# Patient Record
Sex: Female | Born: 1985 | Race: White | Hispanic: No | Marital: Married | Smoking: Never smoker
Health system: Southern US, Community
[De-identification: ages and names within clinical notes are randomized; demographics above are authoritative.]

## PROBLEM LIST (undated history)

## (undated) DIAGNOSIS — Z982 Presence of cerebrospinal fluid drainage device: Secondary | ICD-10-CM

---
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
## 2005-05-07 ENCOUNTER — Emergency Department: Payer: Self-pay | Admitting: Emergency Medicine

## 2005-07-02 ENCOUNTER — Emergency Department (HOSPITAL_COMMUNITY): Admission: EM | Admit: 2005-07-02 | Discharge: 2005-07-03 | Payer: Self-pay | Admitting: Emergency Medicine

## 2007-05-19 ENCOUNTER — Inpatient Hospital Stay (HOSPITAL_COMMUNITY): Admission: AD | Admit: 2007-05-19 | Discharge: 2007-05-19 | Payer: Self-pay | Admitting: Obstetrics & Gynecology

## 2007-06-05 ENCOUNTER — Emergency Department (HOSPITAL_COMMUNITY): Admission: EM | Admit: 2007-06-05 | Discharge: 2007-06-05 | Payer: Self-pay | Admitting: Emergency Medicine

## 2007-06-26 ENCOUNTER — Inpatient Hospital Stay (HOSPITAL_COMMUNITY): Admission: AD | Admit: 2007-06-26 | Discharge: 2007-06-26 | Payer: Self-pay | Admitting: Obstetrics & Gynecology

## 2008-11-05 ENCOUNTER — Emergency Department: Payer: Self-pay | Admitting: Internal Medicine

## 2009-02-08 ENCOUNTER — Inpatient Hospital Stay (HOSPITAL_COMMUNITY): Admission: AD | Admit: 2009-02-08 | Discharge: 2009-02-08 | Payer: Self-pay | Admitting: Obstetrics & Gynecology

## 2010-04-16 LAB — GC/CHLAMYDIA PROBE AMP, GENITAL
Chlamydia, DNA Probe: NEGATIVE
GC Probe Amp, Genital: NEGATIVE

## 2010-04-16 LAB — URINALYSIS, ROUTINE W REFLEX MICROSCOPIC
Bilirubin Urine: NEGATIVE
Nitrite: NEGATIVE
Protein, ur: NEGATIVE mg/dL
Specific Gravity, Urine: >1.03 — ABNORMAL HIGH (ref 1.005–1.030)
Urobilinogen, UA: 0.2 mg/dL (ref 0.0–1.0)

## 2010-04-16 LAB — WET PREP, GENITAL
Clue Cells Wet Prep HPF POC: NONE SEEN
Trich, Wet Prep: NONE SEEN
Yeast Wet Prep HPF POC: NONE SEEN

## 2010-04-16 LAB — URINE MICROSCOPIC-ADD ON

## 2010-04-16 LAB — CBC
MCV: 92.9 fL (ref 78.0–100.0)
Platelets: 206 10*3/uL (ref 150–400)
RDW: 13.6 % (ref 11.5–15.5)
WBC: 9.3 10*3/uL (ref 4.0–10.5)

## 2010-04-16 LAB — ABO/RH: ABO/RH(D): A POS

## 2010-07-26 ENCOUNTER — Emergency Department: Payer: Self-pay | Admitting: Emergency Medicine

## 2010-10-24 LAB — URINALYSIS, ROUTINE W REFLEX MICROSCOPIC
Bilirubin Urine: NEGATIVE
Hgb urine dipstick: NEGATIVE
Ketones, ur: NEGATIVE
Nitrite: NEGATIVE
Protein, ur: NEGATIVE
Specific Gravity, Urine: >1.03 — ABNORMAL HIGH
Urobilinogen, UA: 0.2

## 2010-10-24 LAB — WET PREP, GENITAL
Clue Cells Wet Prep HPF POC: NONE SEEN
Trich, Wet Prep: NONE SEEN

## 2010-10-24 LAB — CBC
HCT: 39.8
Hemoglobin: 13.7
MCHC: 34.5
MCV: 88.7
RBC: 4.49
RDW: 13.2

## 2010-10-24 LAB — POCT PREGNANCY, URINE: Operator id: 23932

## 2010-10-24 LAB — GC/CHLAMYDIA PROBE AMP, GENITAL: GC Probe Amp, Genital: NEGATIVE

## 2010-10-25 LAB — URINALYSIS, ROUTINE W REFLEX MICROSCOPIC
Bilirubin Urine: NEGATIVE
Hgb urine dipstick: NEGATIVE
Nitrite: NEGATIVE
Protein, ur: NEGATIVE
Specific Gravity, Urine: 1.015
Urobilinogen, UA: 0.2

## 2010-10-25 LAB — RAPID STREP SCREEN (MED CTR MEBANE ONLY): Streptococcus, Group A Screen (Direct): NEGATIVE

## 2013-05-15 ENCOUNTER — Ambulatory Visit (INDEPENDENT_AMBULATORY_CARE_PROVIDER_SITE_OTHER): Payer: BLUE CROSS/BLUE SHIELD | Admitting: Physician Assistant

## 2013-05-15 VITALS — BP 116/60 | HR 79 | Temp 98.5°F | Resp 12 | Ht 63.0 in | Wt 138.0 lb

## 2013-05-15 DIAGNOSIS — R109 Unspecified abdominal pain: Secondary | ICD-10-CM

## 2013-05-15 DIAGNOSIS — N939 Abnormal uterine and vaginal bleeding, unspecified: Secondary | ICD-10-CM

## 2013-05-15 DIAGNOSIS — Z113 Encounter for screening for infections with a predominantly sexual mode of transmission: Secondary | ICD-10-CM

## 2013-05-15 DIAGNOSIS — N898 Other specified noninflammatory disorders of vagina: Secondary | ICD-10-CM

## 2013-05-15 LAB — POCT WET PREP WITH KOH
CLUE CELLS WET PREP PER HPF POC: NEGATIVE
KOH Prep POC: NEGATIVE
RBC Wet Prep HPF POC: NEGATIVE
TRICHOMONAS UA: NEGATIVE
Yeast Wet Prep HPF POC: NEGATIVE

## 2013-05-15 LAB — POCT URINE PREGNANCY: PREG TEST UR: NEGATIVE

## 2013-05-15 NOTE — Patient Instructions (Signed)
Safe Sex Safe sex is about reducing the risk of giving or getting a sexually transmitted disease (STD). STDs are spread through sexual contact involving the genitals, mouth, or rectum. Some STDS can be cured and others cannot. Safe sex can also prevent unintended pregnancies.  SAFE SEX PRACTICES  Limit your sexual activity to only one partner who is only having sex with you.  Talk to your partner about their past partners, past STDs, and drug use.  Use a condom every time you have sexual intercourse. This includes vaginal, oral, and anal sexual activity. Both females and males should wear condoms during oral sex. Only use latex or polyurethane condoms and water-based lubricants. Petroleum-based lubricants or oils used to lubricate a condom will weaken the condom and increase the chance that it will break. The condom should be in place from the beginning to the end of sexual activity. Wearing a condom reduces, but does not completely eliminate, your risk of getting or giving a STD. STDs can be spread by contact with skin of surrounding areas.  Get vaccinated for hepatitis B and HPV.  Avoid alcohol and recreational drugs which can affect your judgement. You may forget to use a condom or participate in high-risk sex.  For females, avoid douching after sexual intercourse. Douching can spread an infection farther into the reproductive tract.  Check your body for signs of sores, blisters, rashes, or unusual discharge. See your caregiver if you notice any of these signs.  Avoid sexual contact if you have symptoms of an infection or are being treated for an STD. If you or your partner has herpes, avoid sexual contact when blisters are present. Use condoms at all other times.  See your caregiver for regular screenings, examinations, and tests for STDs. Before having sex with a new partner, each of you should be screened for STDs and talk about the results with your partner. BENEFITS OF SAFE SEX   There  is less of a chance of getting or giving an STD.  You can prevent unwanted or unintended pregnancies.  By discussing safer sex concerns with your partner, you may increase feelings of intimacy, comfort, trust, and honesty between the both of you. Document Released: 02/23/2004 Document Revised: 10/10/2011 Document Reviewed: 07/09/2011 [employer] Patient Information 2014 [employer].  

## 2013-05-15 NOTE — Progress Notes (Signed)
Subjective:    Patient ID: Denise Kline, female    DOB: 03/05/1985, 28 y.o.   MRN: [id number]  HPI   Denise Kline is a pleasant 28 yr old female here requesting STD testing.  She reports that she was with someone about 8 months ago who abruptly left her - she now wonders if maybe "he had something."  She reports some "pinkish bumps" in her groin area - not bothering her, no drainage, no pain, no itching.  She also has a spot on the back of her thigh that she is concerned about.  She does feel like she has had some increased vaginal discharge with some odor.  No itching, irritation.  She notes some vaginal spotting that started yesterday or the day before.  LMP 04/22/13 - regular every month.  She reports some bloating and lower abd discomfort.  Also has some low back pain.  She denies dysuria but is going more frequently.  She has never been tested for STIs.  She is currently sexually active with 1 female partner who she has been with for the last 4 months.  They use condoms inconsistently.  She reports that she and her partner do not have sex very frequently, like once per month, so she does not think she could be pregnant.  Last intercourse was about 1 wk ago, which is likely when she was ovulating.  She is not using any contraception because she is scared of it.  Concerned for blood clots.   Review of Systems  Constitutional: Negative for fever and chills.  Respiratory: Negative.   Cardiovascular: Negative.   Gastrointestinal: Positive for abdominal pain (lower, bloating). Negative for nausea and vomiting.  Genitourinary: Positive for frequency, vaginal bleeding and vaginal discharge. Negative for dysuria, menstrual problem, pelvic pain and dyspareunia.  Musculoskeletal: Negative.   Skin: Negative.        Objective:   Physical Exam  Vitals reviewed. Constitutional: She is oriented to person, place, and time. She appears well-developed and well-nourished. No distress.  HENT:  Head:  Normocephalic and atraumatic.  Eyes: Conjunctivae are normal. No scleral icterus.  Cardiovascular: Normal rate, regular rhythm and normal heart sounds.   Pulmonary/Chest: Effort normal and breath sounds normal. She has no wheezes. She has no rales.  Abdominal: Soft. Bowel sounds are normal. There is no tenderness.  Genitourinary: Uterus normal. There is no rash, tenderness or lesion on the right labia. There is no rash, tenderness or lesion on the left labia. Cervix exhibits no motion tenderness, no discharge and no friability. Right adnexum displays no mass and no tenderness. Left adnexum displays no mass and no tenderness. There is bleeding around the vagina.  Small flesh colored papule at mons pubis - ?molluscum, but no other lesions; small amount of blood in the vaginal vault; cervix normal, not friable; no CMT  Neurological: She is alert and oriented to person, place, and time.  Skin: Skin is warm and dry.  Psychiatric: She has a normal mood and affect. Her behavior is normal.    Results for orders placed in visit on 05/15/13  POCT URINE PREGNANCY      Result Value Ref Range   Preg Test, Ur Negative    POCT WET PREP WITH KOH      Result Value Ref Range   Trichomonas, UA Negative     Clue Cells Wet Prep HPF POC neg     Epithelial Wet Prep HPF POC 1-3     Yeast Wet Prep  HPF POC neg     Bacteria Wet Prep HPF POC 1+     RBC Wet Prep HPF POC neg     WBC Wet Prep HPF POC 0-1     KOH Prep POC Negative         Assessment & Plan:  Screen for STD (sexually transmitted disease) - Plan: POCT Wet Prep with KOH, GC/Chlamydia Probe Amp, HIV antibody, HSV(herpes simplex vrs) 1+2 ab-IgG, RPR  Abdominal pain - Plan: POCT urine pregnancy, POCT Wet Prep with KOH, GC/Chlamydia Probe Amp  Vaginal spotting   Denise Kline is a pleasant 28 yr old female here for STD screening.  Wet prep is negative.  Gc/chlamydia, HIV, RPR, and HSV all pending.  She has a small flesh colored papule at the mons pubis  - it is not bothersome to her.  Possible molluscum?  Since the area is not bothersome, will keep an eye on it for now - if worsening or becoming bothersome would consider treatment/removal.  Pt is currently experiencing vaginal spotting (period due next week.)  This is abnormal for her.  Urine hcg is negative today, but cannot completely exclude pregnancy at this point - last intercourse was about 1 wk ago, which is likely when she was ovulating.  If she does not get a normal period within 2 wks, she needs to check a home pregnancy test.  We discussed contraceptive options and I have given her materials to read over.  Encouraged her to use condoms with every sexual encounter going forward.  Pt to call or RTC if worsening or not improving  E. Frances FurbishElizabeth Mirabella Hilario MHS, PA-C [facility] 4/17/20152:47 PM

## 2013-05-16 LAB — GC/CHLAMYDIA PROBE AMP
CT PROBE, AMP APTIMA: NEGATIVE
GC PROBE AMP APTIMA: NEGATIVE

## 2013-05-16 LAB — HIV ANTIBODY (ROUTINE TESTING W REFLEX): HIV: NONREACTIVE

## 2013-05-16 LAB — RPR

## 2013-05-18 LAB — HSV(HERPES SIMPLEX VRS) I + II AB-IGG: HSV 1 GLYCOPROTEIN G AB, IGG: 0.13 IV

## 2014-04-16 ENCOUNTER — Emergency Department (HOSPITAL_COMMUNITY)
Admission: EM | Admit: 2014-04-16 | Discharge: 2014-04-16 | Disposition: A | Discharge status: Home / Self Care | Payer: Medicaid Other | Attending: Emergency Medicine | Admitting: Emergency Medicine

## 2014-04-16 ENCOUNTER — Encounter (HOSPITAL_COMMUNITY): Payer: Self-pay | Admitting: Neurology

## 2014-04-16 ENCOUNTER — Emergency Department (HOSPITAL_COMMUNITY): Payer: Medicaid Other

## 2014-04-16 DIAGNOSIS — Y92481 Parking lot as the place of occurrence of the external cause: Secondary | ICD-10-CM | POA: Insufficient documentation

## 2014-04-16 DIAGNOSIS — X58XXXA Exposure to other specified factors, initial encounter: Secondary | ICD-10-CM | POA: Diagnosis not present

## 2014-04-16 DIAGNOSIS — Y939 Activity, unspecified: Secondary | ICD-10-CM | POA: Insufficient documentation

## 2014-04-16 DIAGNOSIS — Y990 Civilian activity done for income or pay: Secondary | ICD-10-CM | POA: Insufficient documentation

## 2014-04-16 DIAGNOSIS — S93401A Sprain of unspecified ligament of right ankle, initial encounter: Secondary | ICD-10-CM | POA: Diagnosis not present

## 2014-04-16 DIAGNOSIS — S99911A Unspecified injury of right ankle, initial encounter: Secondary | ICD-10-CM | POA: Diagnosis present

## 2014-04-16 MED ORDER — HYDROCODONE-ACETAMINOPHEN 5-325 MG PO TABS: 1.0000 | ORAL_TABLET | Freq: Four times a day (QID) | ORAL | Status: DC | PRN

## 2014-04-16 NOTE — ED Notes (Addendum)
Pt sts she tripped over a concrete slab in a parking lot.  Small abrasion noted to palm of right hand.  Denies any pain in arms, wrists, back.  Sts pain has been getting increasingly worse in her ankle.  Denies any dizziness before the fall, sts it was "just a trip"

## 2014-04-16 NOTE — ED Notes (Signed)
Pt reports twist right ankle while tripping over cement block. Pt is a x 4

## 2014-04-16 NOTE — Discharge Instructions (Signed)

## 2014-04-16 NOTE — Progress Notes (Signed)
Orthopedic Tech Progress Note Patient Details:  Denise Kline 11/19/1985 [id number]  Ortho Devices Type of Ortho Device: CAM walker, Crutches Ortho Device/Splint Location: RLE Ortho Device/Splint Interventions: Ordered, Application   Jennye MoccasinHughes, Cecil Vandyke Craig 04/16/2014, 9:26 PM

## 2014-04-16 NOTE — ED Provider Notes (Signed)
CSN: [id number]     Arrival date & time 04/16/14  1851 History  This chart was scribed for non-physician practitioner working with Rolland Porter, MD by Murriel Hopper, ED Scribe. This patient was seen in room TR08C/TR08C and the patient's care was started at 8:54 PM.     Chief Complaint  Patient presents with  . Ankle Pain      The history is provided by the patient. No language interpreter was used.     HPI Comments: Denise Kline is a 29 y.o. female who presents to the Emergency Department complaining of worsening constant right ankle pain with associated swelling that has been present since earlier this evening when pt was at work. Pt states she tripped over a cement block while she was trying to step over it and twisted her ankle. Pt states she cannot perform weight-bearing activity without pain. Pt denies any dizziness before the fall.    History reviewed. No pertinent past medical history. History reviewed. No pertinent past surgical history. No family history on file. History  Substance Use Topics  . Smoking status: Never Smoker   . Smokeless tobacco: Not on file  . Alcohol Use: No   OB History    No data available     Review of Systems  Musculoskeletal: Positive for joint swelling and arthralgias.  Neurological: Negative for dizziness.      Allergies  Aspirin  Home Medications   Prior to Admission medications   Not on File   BP 105/58 mmHg  Pulse 78  Temp(Src) 98.5 F (36.9 C) (Oral)  Resp 14  SpO2 98%  LMP 04/16/2014 Physical Exam  Constitutional: She is oriented to person, place, and time. She appears well-developed and well-nourished.  HENT:  Head: Normocephalic and atraumatic.  Cardiovascular: Normal rate and intact distal pulses.   Intact distal pulses  Pulmonary/Chest: Effort normal.  Abdominal: She exhibits no distension.  Musculoskeletal:  Moderate tenderness to palpation over ATFL ligament and over dorsal aspect of the foot  No bony  abnormality or deformity Limited ROM in strength secondary to pain  Neurological: She is alert and oriented to person, place, and time.  Sensation intact    Skin: Skin is warm and dry.  Psychiatric: She has a normal mood and affect.  Nursing note and vitals reviewed.   ED Course  Procedures (including critical care time)  DIAGNOSTIC STUDIES: Oxygen Saturation is 98% on RA, normal by my interpretation.    COORDINATION OF CARE: 8:58 PM Discussed treatment plan with pt at bedside and pt agreed to plan.   Labs Review Labs Reviewed - No data to display  Imaging Review Dg Ankle Complete Right  04/16/2014   CLINICAL DATA:  Ankle pain,  EXAM: RIGHT ANKLE - COMPLETE 3+ VIEW  COMPARISON:  None.  FINDINGS: Ankle mortise intact. The talar dome is normal. No malleolar fracture. The calcaneus is normal.  IMPRESSION: No fracture or dislocation.   Electronically Signed   By: Genevive Bi M.D.   On: 04/16/2014 19:46   Dg Foot Complete Right  04/16/2014   CLINICAL DATA:  Ankle pain  EXAM: RIGHT FOOT COMPLETE - 3+ VIEW  COMPARISON:  None.  FINDINGS: No fracture or dislocation of mid foot or forefoot. The phalanges are normal. The calcaneus is normal. No soft tissue abnormality.  IMPRESSION: No fracture or dislocation.   Electronically Signed   By: Genevive Bi M.D.   On: 04/16/2014 19:47     EKG Interpretation None  MDM   Final diagnoses:  Ankle sprain, right, initial encounter    Patient with probable ankle sprain, after tripping on a parking barrier. Plain films are negative. Will was patient may Cam Walker, give crutches, treat pain. Recommend orthopedic follow-up. Patient understands and agrees to plan. She is stable and ready for discharge.  I personally performed the services described in this documentation, which was scribed in my presence. The recorded information has been reviewed and is accurate.     Roxy Horsemanobert Suhey Radford, PA-C 04/16/14 2202  Rolland PorterMark James, MD 04/17/14  2206

## 2014-06-18 ENCOUNTER — Emergency Department (HOSPITAL_COMMUNITY): Payer: Medicaid Other

## 2014-06-18 ENCOUNTER — Encounter (HOSPITAL_COMMUNITY): Payer: Self-pay | Admitting: *Deleted

## 2014-06-18 ENCOUNTER — Emergency Department (HOSPITAL_COMMUNITY)
Admission: EM | Admit: 2014-06-18 | Discharge: 2014-06-18 | Disposition: A | Discharge status: Home / Self Care | Payer: Medicaid Other | Attending: Emergency Medicine | Admitting: Emergency Medicine

## 2014-06-18 DIAGNOSIS — M6281 Muscle weakness (generalized): Secondary | ICD-10-CM | POA: Diagnosis not present

## 2014-06-18 DIAGNOSIS — R079 Chest pain, unspecified: Secondary | ICD-10-CM | POA: Insufficient documentation

## 2014-06-18 LAB — BASIC METABOLIC PANEL
Anion gap: 7 (ref 5–15)
BUN: 13 mg/dL (ref 6–20)
CO2: 27 mmol/L (ref 22–32)
Calcium: 9.5 mg/dL (ref 8.9–10.3)
Chloride: 105 mmol/L (ref 101–111)
Creatinine, Ser: 0.82 mg/dL (ref 0.44–1.00)
GFR calc Af Amer: >60 mL/min (ref >60)
GFR calc non Af Amer: >60 mL/min (ref >60)
Glucose, Bld: 101 mg/dL — ABNORMAL HIGH (ref 65–99)
POTASSIUM: 4 mmol/L (ref 3.5–5.1)
SODIUM: 139 mmol/L (ref 135–145)

## 2014-06-18 LAB — CBC WITH DIFFERENTIAL/PLATELET
BASOS ABS: 0.1 10*3/uL (ref 0.0–0.1)
BASOS PCT: 1 % (ref 0–1)
EOS ABS: 0.1 10*3/uL (ref 0.0–0.7)
Eosinophils Relative: 1 % (ref 0–5)
HCT: 42.5 % (ref 36.0–46.0)
Hemoglobin: 13.5 g/dL (ref 12.0–15.0)
Lymphocytes Relative: 25 % (ref 12–46)
Lymphs Abs: 1.7 10*3/uL (ref 0.7–4.0)
MCH: 29.2 pg (ref 26.0–34.0)
MCHC: 31.8 g/dL (ref 30.0–36.0)
MCV: 91.8 fL (ref 78.0–100.0)
MONO ABS: 0.7 10*3/uL (ref 0.1–1.0)
MONOS PCT: 10 % (ref 3–12)
Neutro Abs: 4.3 10*3/uL (ref 1.7–7.7)
Neutrophils Relative %: 63 % (ref 43–77)
Platelets: 224 10*3/uL (ref 150–400)
RBC: 4.63 MIL/uL (ref 3.87–5.11)
RDW: 12.9 % (ref 11.5–15.5)
WBC: 6.8 10*3/uL (ref 4.0–10.5)

## 2014-06-18 MED ORDER — IOHEXOL 350 MG/ML SOLN
100.0000 mL | Freq: Once | INTRAVENOUS | Status: AC | PRN
  Administered 2014-06-18: 100 mL via INTRAVENOUS

## 2014-06-18 MED ORDER — OXYCODONE-ACETAMINOPHEN 5-325 MG PO TABS: 1.0000 | ORAL_TABLET | ORAL | Status: DC | PRN

## 2014-06-18 MED ORDER — SODIUM CHLORIDE 0.9 % IV SOLN
INTRAVENOUS | Status: DC
  Administered 2014-06-18: 18:00:00 via INTRAVENOUS

## 2014-06-18 MED ORDER — LORAZEPAM 2 MG/ML IJ SOLN
1.0000 mg | Freq: Once | INTRAMUSCULAR | Status: AC
  Administered 2014-06-18: 1 mg via INTRAVENOUS
  Filled 2014-06-18: qty 1

## 2014-06-18 MED ORDER — METHOCARBAMOL 500 MG PO TABS: 500.0000 mg | ORAL_TABLET | Freq: Two times a day (BID) | ORAL | Status: DC

## 2014-06-18 MED ORDER — MORPHINE SULFATE 4 MG/ML IJ SOLN
4.0000 mg | Freq: Once | INTRAMUSCULAR | Status: AC
  Administered 2014-06-18: 4 mg via INTRAVENOUS
  Filled 2014-06-18: qty 1

## 2014-06-18 NOTE — ED Provider Notes (Signed)
CSN: [id number]     Arrival date & time 06/18/14  1608 History   First MD Initiated Contact with Patient 06/18/14 1642     Chief Complaint  Patient presents with  . Numbness  . Extremity Weakness     (Consider location/radiation/quality/duration/timing/severity/associated sxs/prior Treatment) HPI Comments: Patient here complaining of sudden onset of left-sided chest pain that occur while driving. Pain is been persistent and radiates to her shoulder as well as to her supraclavicular region. She feels dyspneic and diaphoretic when she has the pain. Pain characterized as sharp. Pain does radiate to her left arm as well as to a makes it difficult to lift it up. Hasn't had any neck pain. Denies any headache. Denies any numbness or tingling to her left hand. No prior history of same. Denies any history of trauma. Symptoms persistent and nothing makes it better. No treatment use prior to arrival.  Patient is a 29 y.o. female presenting with extremity weakness. The history is provided by the patient.  Extremity Weakness    History reviewed. No pertinent past medical history. History reviewed. No pertinent past surgical history. No family history on file. History  Substance Use Topics  . Smoking status: Never Smoker   . Smokeless tobacco: Not on file  . Alcohol Use: No   OB History    No data available     Review of Systems  Musculoskeletal: Positive for extremity weakness.  All other systems reviewed and are negative.     Allergies  Aspirin  Home Medications   Prior to Admission medications   Medication Sig Start Date End Date Taking? Authorizing Provider  acetaminophen (TYLENOL) 500 MG tablet Take 1,000 mg by mouth every 6 (six) hours as needed for mild pain, moderate pain or headache.   Yes Historical Provider, MD  HYDROcodone-acetaminophen (NORCO/VICODIN) 5-325 MG per tablet Take 1-2 tablets by mouth every 6 (six) hours as needed. Patient not taking: Reported on 06/18/2014  04/16/14   Roxy Horsemanobert Browning, PA-C   BP 112/73 mmHg  Pulse 64  Temp(Src) 98.4 F (36.9 C) (Oral)  Resp 17  SpO2 100%  LMP 06/14/2014 Physical Exam  Constitutional: She is oriented to person, place, and time. She appears well-developed and well-nourished.  Non-toxic appearance. No distress.  HENT:  Head: Normocephalic and atraumatic.  Eyes: Conjunctivae, EOM and lids are normal. Pupils are equal, round, and reactive to light.  Neck: Normal range of motion. Neck supple. No tracheal deviation present. No thyroid mass present.  Cardiovascular: Normal rate, regular rhythm and normal heart sounds.  Exam reveals no gallop.   No murmur heard. Pulmonary/Chest: Effort normal and breath sounds normal. No stridor. No respiratory distress. She has no decreased breath sounds. She has no wheezes. She has no rhonchi. She has no rales.  Abdominal: Soft. Normal appearance and bowel sounds are normal. She exhibits no distension. There is no tenderness. There is no rebound and no CVA tenderness.  Musculoskeletal: Normal range of motion. She exhibits no edema or tenderness.  Neurological: She is alert and oriented to person, place, and time. She has normal strength. No cranial nerve deficit or sensory deficit. GCS eye subscore is 4. GCS verbal subscore is 5. GCS motor subscore is 6.  Skin: Skin is warm and dry. No abrasion and no rash noted.  Psychiatric: She has a normal mood and affect. Her speech is normal and behavior is normal.  Nursing note and vitals reviewed.   ED Course  Procedures (including critical care time) Labs Review Labs  Reviewed  CBC WITH DIFFERENTIAL/PLATELET  BASIC METABOLIC PANEL    Imaging Review No results found.   EKG Interpretation None      MDM   Final diagnoses:  Chest pain   Patient had a negative chest CT. Given pain meds here feels better. She has no signs of vascular compromise to her left arm. Neurologically intact. Stable for discharge     Lorre NickAnthony Dontrae Morini,  MD 06/18/14 2025

## 2014-06-18 NOTE — ED Notes (Signed)
Bed: [id number] Expected date:  Expected time:  Means of arrival:  Comments: Ca ctr pt

## 2014-06-18 NOTE — ED Notes (Signed)
Per EMS- Patient c/o sudden onset of left arm weakness and numbness while driving her car. Strong hand grips, + radial pulses, negative for any injury.

## 2014-06-18 NOTE — Discharge Instructions (Signed)
Return here for severe pain to her chest, shortness of breath, if you are unable to use her left arm.  Chest Pain (Nonspecific) It is often hard to give a specific diagnosis for the cause of chest pain. There is always a chance that your pain could be related to something serious, such as a heart attack or a blood clot in the lungs. You need to follow up with your health care provider for further evaluation. CAUSES   Heartburn.  Pneumonia or bronchitis.  Anxiety or stress.  Inflammation around your heart (pericarditis) or lung (pleuritis or pleurisy).  A blood clot in the lung.  A collapsed lung (pneumothorax). It can develop suddenly on its own (spontaneous pneumothorax) or from trauma to the chest.  Shingles infection (herpes zoster virus). The chest wall is composed of bones, muscles, and cartilage. Any of these can be the source of the pain.  The bones can be bruised by injury.  The muscles or cartilage can be strained by coughing or overwork.  The cartilage can be affected by inflammation and become sore (costochondritis). DIAGNOSIS  Lab tests or other studies may be needed to find the cause of your pain. Your health care provider may have you take a test called an ambulatory electrocardiogram (ECG). An ECG records your heartbeat patterns over a 24-hour period. You may also have other tests, such as:  Transthoracic echocardiogram (TTE). During echocardiography, sound waves are used to evaluate how blood flows through your heart.  Transesophageal echocardiogram (TEE).  Cardiac monitoring. This allows your health care provider to monitor your heart rate and rhythm in real time.  Holter monitor. This is a portable device that records your heartbeat and can help diagnose heart arrhythmias. It allows your health care provider to track your heart activity for several days, if needed.  Stress tests by exercise or by giving medicine that makes the heart beat faster. TREATMENT    Treatment depends on what may be causing your chest pain. Treatment may include:  Acid blockers for heartburn.  Anti-inflammatory medicine.  Pain medicine for inflammatory conditions.  Antibiotics if an infection is present.  You may be advised to change lifestyle habits. This includes stopping smoking and avoiding alcohol, caffeine, and chocolate.  You may be advised to keep your head raised (elevated) when sleeping. This reduces the chance of acid going backward from your stomach into your esophagus. Most of the time, nonspecific chest pain will improve within 2-3 days with rest and mild pain medicine.  HOME CARE INSTRUCTIONS   If antibiotics were prescribed, take them as directed. Finish them even if you start to feel better.  For the next few days, avoid physical activities that bring on chest pain. Continue physical activities as directed.  Do not use any tobacco products, including cigarettes, chewing tobacco, or electronic cigarettes.  Avoid drinking alcohol.  Only take medicine as directed by your health care provider.  Follow your health care provider's suggestions for further testing if your chest pain does not go away.  Keep any follow-up appointments you made. If you do not go to an appointment, you could develop lasting (chronic) problems with pain. If there is any problem keeping an appointment, call to reschedule. SEEK MEDICAL CARE IF:   Your chest pain does not go away, even after treatment.  You have a rash with blisters on your chest.  You have a fever. SEEK IMMEDIATE MEDICAL CARE IF:   You have increased chest pain or pain that spreads to your  arm, neck, jaw, back, or abdomen.  You have shortness of breath.  You have an increasing cough, or you cough up blood.  You have severe back or abdominal pain.  You feel nauseous or vomit.  You have severe weakness.  You faint.  You have chills. This is an emergency. Do not wait to see if the pain will  go away. Get medical help at once. Call your local emergency services (911 in [place] Do not drive yourself to the hospital. MAKE SURE YOU:   Understand these instructions.  Will watch your condition.  Will get help right away if you are not doing well or get worse. Document Released: 10/25/2004 Document Revised: 01/20/2013 Document Reviewed: 08/21/2007 [facility] Patient Information 2015 [place], [place]. This information is not intended to replace advice given to you by your health care provider. Make sure you discuss any questions you have with your health care provider.

## 2014-06-18 NOTE — ED Notes (Addendum)
Pt reports sudden onset of L side pain that radiates to her L neck today while driving.  Then suddenly her L arm became numb and it fell off of the steering wheel.  Pt reports she is able to move her L arm now but is still numb.  Denies any hx of spine problems at  This time.  Pt reports pain in L side of her neck.  States when it gets worse, pain shoots down her L arm causing numbness to L arm without warning.

## 2014-06-20 ENCOUNTER — Emergency Department (HOSPITAL_COMMUNITY)
Admission: EM | Admit: 2014-06-20 | Discharge: 2014-06-20 | Disposition: A | Discharge status: Home / Self Care | Payer: Medicaid Other | Attending: Emergency Medicine | Admitting: Emergency Medicine

## 2014-06-20 ENCOUNTER — Encounter (HOSPITAL_COMMUNITY): Payer: Self-pay | Admitting: *Deleted

## 2014-06-20 DIAGNOSIS — Z3202 Encounter for pregnancy test, result negative: Secondary | ICD-10-CM | POA: Diagnosis not present

## 2014-06-20 DIAGNOSIS — R109 Unspecified abdominal pain: Secondary | ICD-10-CM | POA: Insufficient documentation

## 2014-06-20 DIAGNOSIS — Z79899 Other long term (current) drug therapy: Secondary | ICD-10-CM | POA: Diagnosis not present

## 2014-06-20 DIAGNOSIS — R350 Frequency of micturition: Secondary | ICD-10-CM | POA: Insufficient documentation

## 2014-06-20 HISTORY — DX: Presence of cerebrospinal fluid drainage device: Z98.2

## 2014-06-20 LAB — URINALYSIS, ROUTINE W REFLEX MICROSCOPIC
Bilirubin Urine: NEGATIVE
GLUCOSE, UA: NEGATIVE mg/dL
HGB URINE DIPSTICK: NEGATIVE
Ketones, ur: NEGATIVE mg/dL
Leukocytes, UA: NEGATIVE
Nitrite: NEGATIVE
Protein, ur: NEGATIVE mg/dL
Specific Gravity, Urine: 1.02 (ref 1.005–1.030)
Urobilinogen, UA: 1 mg/dL (ref 0.0–1.0)
pH: 7 (ref 5.0–8.0)

## 2014-06-20 LAB — POC URINE PREG, ED: PREG TEST UR: NEGATIVE

## 2014-06-20 NOTE — ED Notes (Signed)
Pt reports being seen Friday for neck pain that went into her L side and made her L arm go numb. Now feels like the pain has moved to her L flank, waxing and waning. Denies urinary symptoms. Pt reports neck pain still present but not near as severe as previously.

## 2014-06-20 NOTE — ED Provider Notes (Signed)
CSN: [id number]     Arrival date & time 06/20/14  1343 History   First MD Initiated Contact with Patient 06/20/14 1521     Chief Complaint  Patient presents with  . Flank Pain   Denise Kline is a 29 y.o. female who presents to the ED complaining of intermittent left flank pain since yesterday. The patient reports she started having left flank pain that was sharp last night and then resolved. She reports she woke up with left flank pain again this morning but then resolved approximately 3 hours prior to arrival to the emergency department. She currently denies any pain. Patient reports she had sharp 6 out of 10 pain in her left flank and took a Robaxin and her pain has resolved now. Patient also reports associated urinary frequency and urgency to urinate for the past 3 months. Patient denies dysuria or hematuria. She denies history kidney stones. Her last cycle was 06/14/2014 and was normal. Patient reports her pain felt better when laying on her left side. The patient denies fevers, chills, abdominal pain, nausea, vomiting, vaginal bleeding, vaginal discharge, dysuria, hematuria, difficulty urinating, changes to the amount of urination, or rashes. The patient denies trauma or injury to her back. The patient denies any numbness or tingling. Patient denies chest pain, shortness of breath or coughing. The patient was seen 2 days ago complaining of left neck, chest and arm pain. She reports she had an episode where her arm went numb. She reports these symptoms have since resolved and not returned. Patient had a negative CT angiogram of her chest at that time 2 days ago.  (Consider location/radiation/quality/duration/timing/severity/associated sxs/prior Treatment) HPI  Past Medical History  Diagnosis Date  . VP (ventriculoperitoneal) shunt status    History reviewed. No pertinent past surgical history. No family history on file. History  Substance Use Topics  . Smoking status: Never Smoker   .  Smokeless tobacco: Not on file  . Alcohol Use: No   OB History    No data available     Review of Systems  Constitutional: Negative for fever and chills.  HENT: Negative for congestion, ear pain and sore throat.   Eyes: Negative for pain and visual disturbance.  Respiratory: Negative for cough and shortness of breath.   Cardiovascular: Negative for chest pain.  Gastrointestinal: Negative for nausea, vomiting, abdominal pain and diarrhea.  Genitourinary: Positive for urgency, frequency and flank pain. Negative for dysuria, hematuria, vaginal bleeding, vaginal discharge and difficulty urinating.  Musculoskeletal: Negative for back pain and neck pain.  Skin: Negative for rash.  Neurological: Negative for syncope, weakness, light-headedness, numbness and headaches.      Allergies  Aspirin  Home Medications   Prior to Admission medications   Medication Sig Start Date End Date Taking? Authorizing Provider  acetaminophen (TYLENOL) 500 MG tablet Take 1,000 mg by mouth every 6 (six) hours as needed for mild pain, moderate pain or headache.   Yes Historical Provider, MD  methocarbamol (ROBAXIN) 500 MG tablet Take 1 tablet (500 mg total) by mouth 2 (two) times daily. Patient taking differently: Take 500 mg by mouth 2 (two) times daily as needed for muscle spasms.  06/18/14  Yes Lorre NickAnthony Allen, MD  oxyCODONE-acetaminophen (PERCOCET/ROXICET) 5-325 MG per tablet Take 1-2 tablets by mouth every 4 (four) hours as needed for severe pain. 06/18/14  Yes Lorre NickAnthony Allen, MD  HYDROcodone-acetaminophen (NORCO/VICODIN) 5-325 MG per tablet Take 1-2 tablets by mouth every 6 (six) hours as needed. Patient not taking: Reported on  06/18/2014 04/16/14   Roxy Horseman, PA-C   BP 105/65 mmHg  Pulse 70  Temp(Src) 98 F (36.7 C)  Resp 20  SpO2 97%  LMP 06/14/2014 Physical Exam  Constitutional: She appears well-developed and well-nourished. No distress.  Nontoxic appearing.  HENT:  Head: Normocephalic and  atraumatic.  Mouth/Throat: Oropharynx is clear and moist. No oropharyngeal exudate.  Eyes: Conjunctivae are normal. Pupils are equal, round, and reactive to light. Right eye exhibits no discharge. Left eye exhibits no discharge.  Neck: Normal range of motion. Neck supple. No JVD present. No tracheal deviation present.  No neck tenderness. No midline neck tenderness.  Cardiovascular: Normal rate, regular rhythm, normal heart sounds and intact distal pulses.  Exam reveals no gallop and no friction rub.   No murmur heard. Pulmonary/Chest: Effort normal and breath sounds normal. No respiratory distress. She has no wheezes. She has no rales.  Abdominal: Soft. Bowel sounds are normal. She exhibits no distension and no mass. There is no tenderness. There is no rebound and no guarding.  Abdomen is soft and nontender to palpation. Bowel sounds are present. No McBurney's point tenderness.  Negative Rovsing sign. Negative psoas and obturator sign. No CVA tenderness.    Musculoskeletal: Normal range of motion. She exhibits no edema or tenderness.  No bony point back tenderness palpation no back tenderness to palpation. There is no back edema, deformity, step-offs or crepitus. The patient has 5 out of 5 strength in her bilateral upper and lower extremities.  Lymphadenopathy:    She has no cervical adenopathy.  Neurological: She is alert. Coordination normal.  Sensation is intact in her bilateral upper and lower extremities.  Skin: Skin is warm and dry. No rash noted. She is not diaphoretic. No erythema. No pallor.  Psychiatric: She has a normal mood and affect. Her behavior is normal.  Nursing note and vitals reviewed.   ED Course  Procedures (including critical care time) Labs Review Labs Reviewed  URINALYSIS, ROUTINE W REFLEX MICROSCOPIC - Abnormal; Notable for the following:    APPearance CLOUDY (*)    All other components within normal limits  POC URINE PREG, ED    Imaging Review Ct Angio  Chest Pe W/cm &/or Wo Cm  06/18/2014   CLINICAL DATA:  sudden onset of L side pain that radiates to her L neck today while driving. Then suddenly her L arm became numb and it fell off of the steering wheel. Pt reports she is able to move her L arm now but is still numb. Denies any hx of spine problems at This time. Pt reports pain in L side of her neck. States when it gets worse, pain shoots down her L arm causing numbness to L arm without warning  EXAM: CT ANGIOGRAPHY CHEST WITH CONTRAST  TECHNIQUE: Multidetector CT imaging of the chest was performed using the standard protocol during bolus administration of intravenous contrast. Multiplanar CT image reconstructions and MIPs were obtained to evaluate the vascular anatomy.  CONTRAST:  OMNIPAQUE IOHEXOL 350 MG/ML SOLN  COMPARISON:  None.  FINDINGS: Left arm contrast injection. SVC patent. Mild four-chamber cardiac enlargement. Satisfactory opacification of pulmonary arteries noted, and there is no evidence of pulmonary emboli. Patent bilateral pulmonary veins. Adequate contrast opacification of the thoracic aorta with no evidence of dissection, aneurysm, or stenosis. There is classic 3-vessel brachiocephalic arch anatomy without proximal stenosis. No significant atheromatous plaque.  No pleural or pericardial effusion. No hilar or mediastinal adenopathy. Lungs are clear. Thoracic spine and sternum intact.  Coarse posterior calcifications mid thoracic spinal canal . Discontinuous VP shunt catheter in the anterior body wall and left upper abdomen. Remainder of upper abdomen unremarkable.  Review of the MIP images confirms the above findings.  IMPRESSION: 1. No acute PE, thoracic aortic dissection, or other acute finding.   Electronically Signed   By: Corlis Leak M.D.   On: 06/18/2014 18:52     EKG Interpretation None      Filed Vitals:   06/20/14 1356  BP: 105/65  Pulse: 70  Temp: 98 F (36.7 C)  Resp: 20  SpO2: 97%     MDM   Meds given in  ED:  Medications - No data to display  New Prescriptions   No medications on file    Final diagnoses:  Left flank pain   This is a 29 y.o. female who presents to the ED complaining of intermittent left flank pain since yesterday. The patient reports she started having left flank pain that was sharp last night and then resolved. She reports she woke up with left flank pain again this morning but then resolved approximately 3 hours prior to arrival to the emergency department. She currently denies any pain. She has been pain-free since arrival to the emergency department. She denies having any abdominal pain, nausea or vomiting. Patient reports taking roboxin and this resolved her pain. On exam the patient is afebrile and nontoxic appearing. She has no abdominal tenderness palpation. There is no back or flank pain on exam. She denies any urinary symptoms. Will check urinalysis and pregnancy test. She is a negative urine pregnancy. Her urinalysis is unremarkable. It is negative for nitrites and leukocytes. Her there is no hemoglobin in her urine. This does not seem consistent with a kidney stone. This patient's pain likely related to muscle spasm. At revaluation the patient still denies any pain and is ready to be discharged. Strict return precautions provided. I advised the patient to follow-up with their primary care provider this week. I advised the patient to return to the emergency department with new or worsening symptoms or new concerns. The patient verbalized understanding and agreement with plan.    This patient was discussed with Dr. Donnald Garre who agrees with assessment and plan.     Everlene Farrier, PA-C 06/20/14 1659  Arby Barrette, MD 06/22/14 1444

## 2014-06-20 NOTE — Discharge Instructions (Signed)
Flank Pain °Flank pain refers to pain that is located on the side of the body between the upper abdomen and the back. The pain may occur over a short period of time (acute) or may be long-term or reoccurring (chronic). It may be mild or severe. Flank pain can be caused by many things. °CAUSES  °Some of the more common causes of flank pain include: °· Muscle strains.   °· Muscle spasms.   °· A disease of your spine (vertebral disk disease).   °· A lung infection (pneumonia).   °· Fluid around your lungs (pulmonary edema).   °· A kidney infection.   °· Kidney stones.   °· A very painful skin rash caused by the chickenpox virus (shingles).   °· Gallbladder disease.   °HOME CARE INSTRUCTIONS  °Home care will depend on the cause of your pain. In general, °· Rest as directed by your caregiver. °· Drink enough fluids to keep your urine clear or pale yellow. °· Only take over-the-counter or prescription medicines as directed by your caregiver. Some medicines may help relieve the pain. °· Tell your caregiver about any changes in your pain. °· Follow up with your caregiver as directed. °SEEK IMMEDIATE MEDICAL CARE IF:  °· Your pain is not controlled with medicine.   °· You have new or worsening symptoms. °· Your pain increases.   °· You have abdominal pain.   °· You have shortness of breath.   °· You have persistent nausea or vomiting.   °· You have swelling in your abdomen.   °· You feel faint or pass out.   °· You have blood in your urine. °· You have a fever or persistent symptoms for more than 2-3 days. °· You have a fever and your symptoms suddenly get worse. °MAKE SURE YOU:  °· Understand these instructions. °· Will watch your condition. °· Will get help right away if you are not doing well or get worse. °Document Released: 03/08/2005 Document Revised: 10/10/2011 Document Reviewed: 08/30/2011 °[employer]® Patient Information ©2015 [employer]. This information is not intended to replace advice given to you by your  health care provider. Make sure you discuss any questions you have with your health care provider. ° °

## 2015-04-21 IMAGING — CR DG FOOT COMPLETE 3+V*R*
3 series · 3 of 3 positions shown · non-contrast
Comparison: None.

CLINICAL DATA: Ankle pain

EXAM:
RIGHT FOOT COMPLETE - 3+ VIEW

[foot ap]
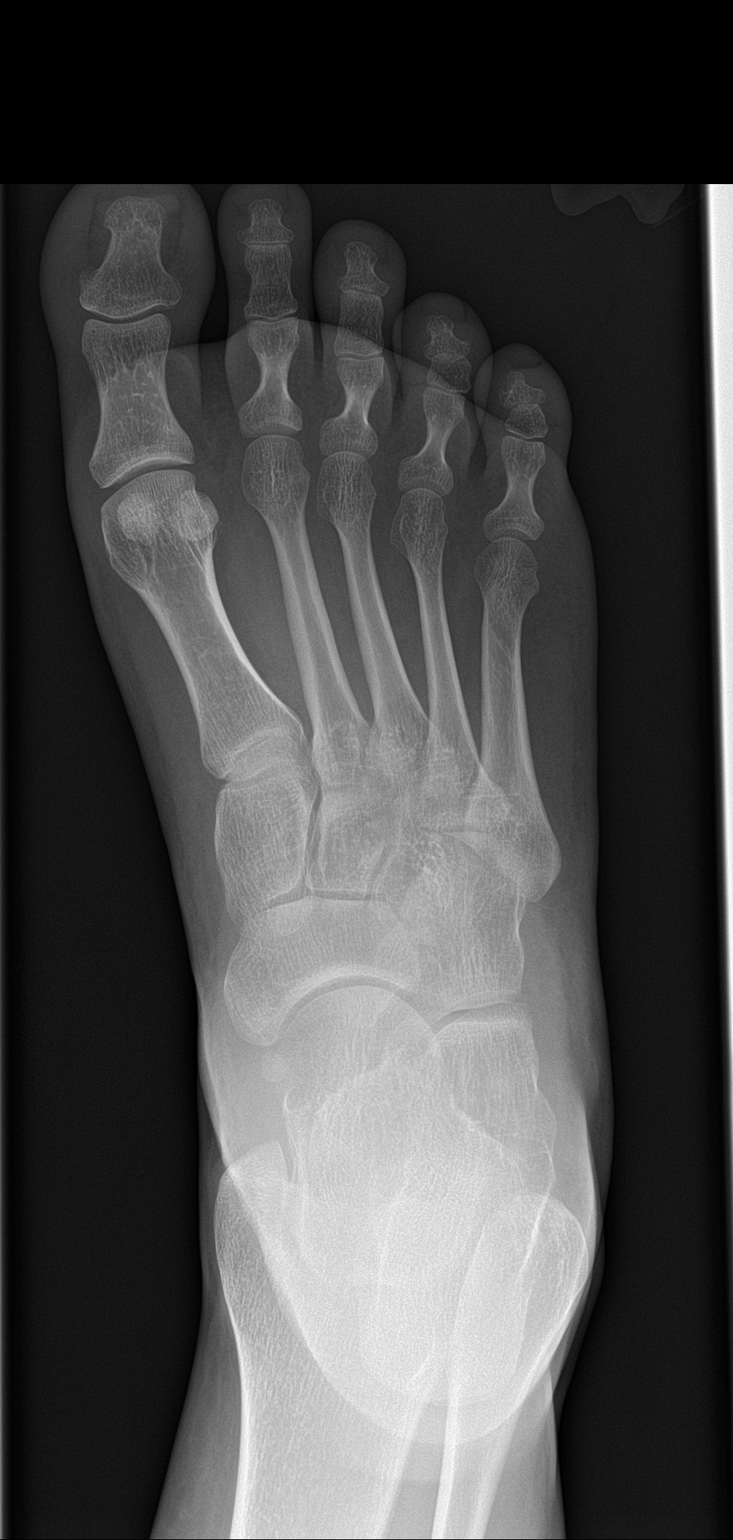

[foot obl]
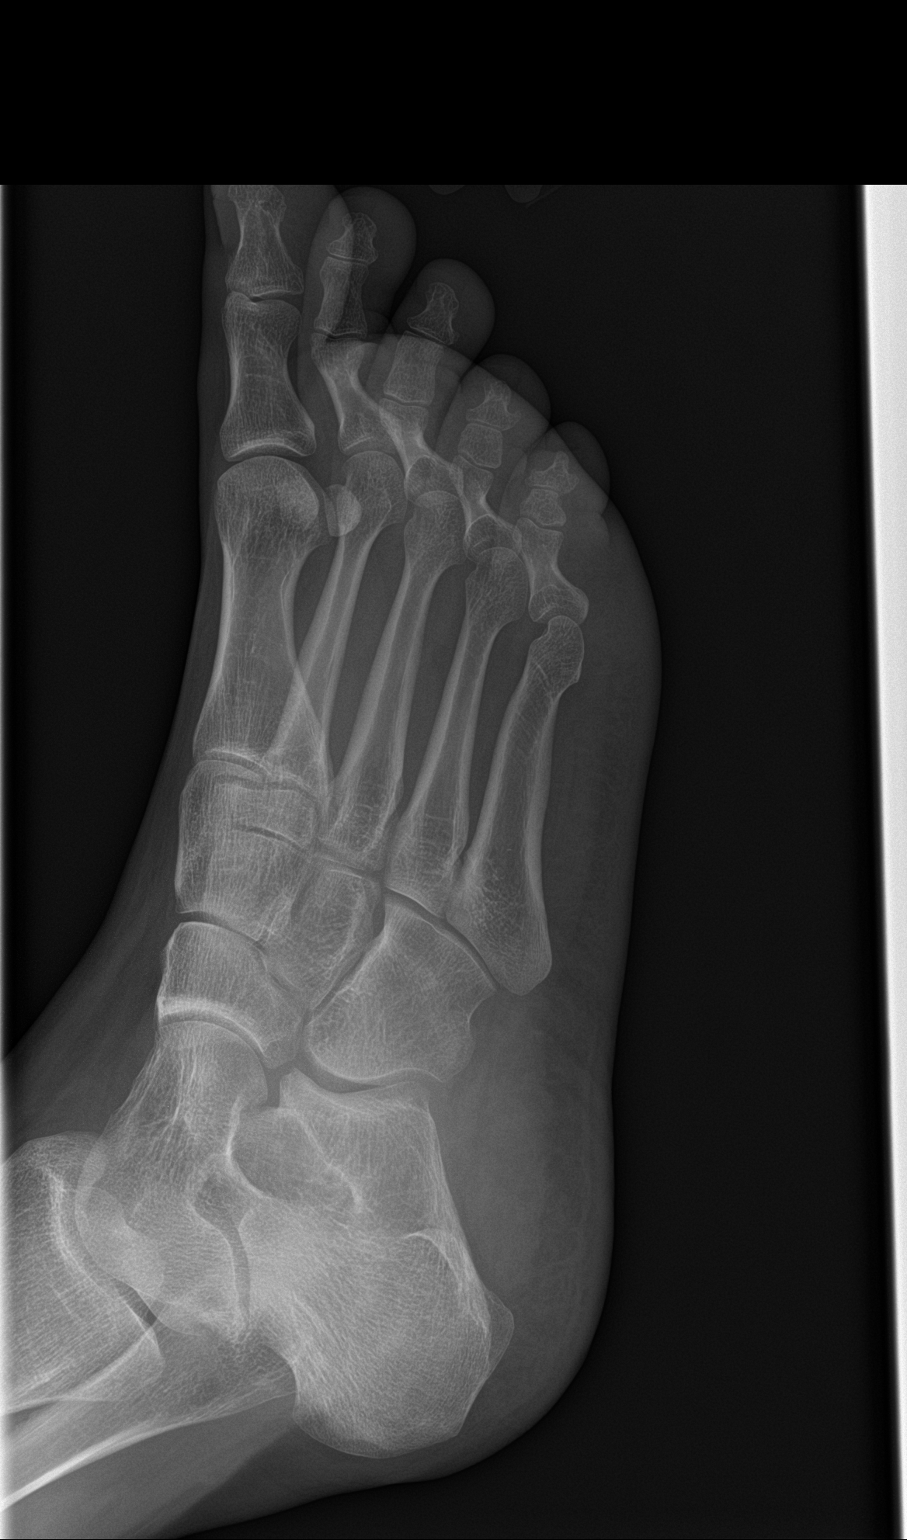

[foot lat]
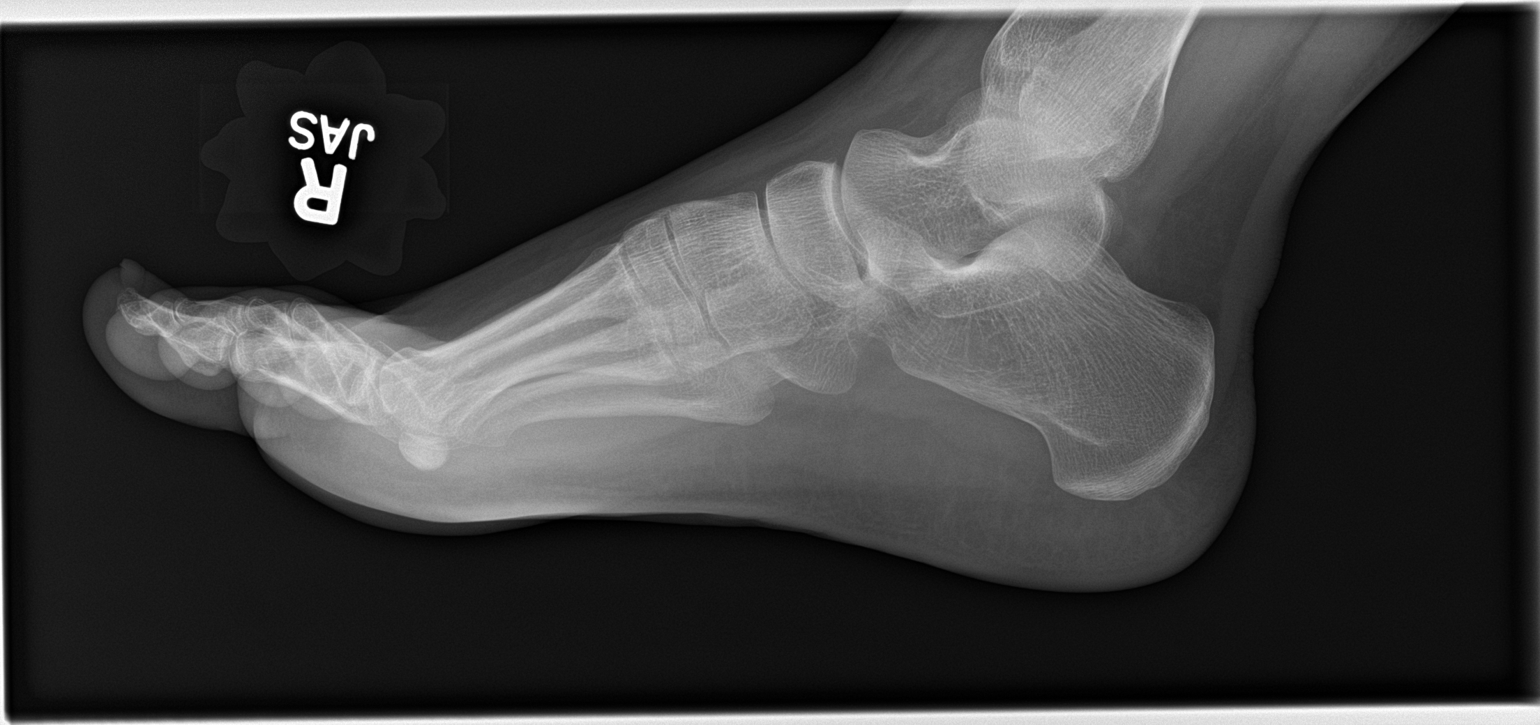

[3 of 3 positions shown; findings below may reference images not displayed]

FINDINGS: No fracture or dislocation of mid foot or forefoot. The phalanges
are normal. The calcaneus is normal. No soft tissue abnormality.
IMPRESSION: No fracture or dislocation.
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
## 2018-08-14 ENCOUNTER — Other Ambulatory Visit: Payer: Self-pay

## 2018-08-14 ENCOUNTER — Other Ambulatory Visit: Payer: Self-pay | Admitting: Internal Medicine

## 2018-08-14 DIAGNOSIS — Z20822 Contact with and (suspected) exposure to covid-19: Secondary | ICD-10-CM

## 2018-08-19 LAB — NOVEL CORONAVIRUS, NAA: SARS-CoV-2, NAA: NOT DETECTED

## 2018-08-29 ENCOUNTER — Encounter (HOSPITAL_COMMUNITY): Payer: Self-pay

## 2018-08-29 ENCOUNTER — Emergency Department (HOSPITAL_COMMUNITY)
Admission: EM | Admit: 2018-08-29 | Discharge: 2018-08-29 | Disposition: A | Discharge status: Home / Self Care | Payer: BC Managed Care – PPO | Attending: Emergency Medicine | Admitting: Emergency Medicine

## 2018-08-29 ENCOUNTER — Emergency Department (HOSPITAL_COMMUNITY): Payer: BC Managed Care – PPO

## 2018-08-29 ENCOUNTER — Other Ambulatory Visit: Payer: Self-pay

## 2018-08-29 DIAGNOSIS — R002 Palpitations: Secondary | ICD-10-CM | POA: Diagnosis not present

## 2018-08-29 DIAGNOSIS — R0789 Other chest pain: Secondary | ICD-10-CM | POA: Diagnosis present

## 2018-08-29 LAB — CBC
HCT: 45.2 % (ref 36.0–46.0)
Hemoglobin: 14.4 g/dL (ref 12.0–15.0)
MCH: 29.3 pg (ref 26.0–34.0)
MCHC: 31.9 g/dL (ref 30.0–36.0)
MCV: 92.1 fL (ref 80.0–100.0)
Platelets: 265 10*3/uL (ref 150–400)
RBC: 4.91 MIL/uL (ref 3.87–5.11)
RDW: 13.1 % (ref 11.5–15.5)
WBC: 6.5 10*3/uL (ref 4.0–10.5)
nRBC: 0 % (ref 0.0–0.2)

## 2018-08-29 LAB — BASIC METABOLIC PANEL
Anion gap: 10 (ref 5–15)
BUN: 6 mg/dL (ref 6–20)
CO2: 20 mmol/L — ABNORMAL LOW (ref 22–32)
Calcium: 9.5 mg/dL (ref 8.9–10.3)
Chloride: 109 mmol/L (ref 98–111)
Creatinine, Ser: 0.88 mg/dL (ref 0.44–1.00)
GFR calc Af Amer: >60 mL/min (ref >60)
GFR calc non Af Amer: >60 mL/min (ref >60)
Glucose, Bld: 86 mg/dL (ref 70–99)
Potassium: 3.3 mmol/L — ABNORMAL LOW (ref 3.5–5.1)
Sodium: 139 mmol/L (ref 135–145)

## 2018-08-29 LAB — TROPONIN I (HIGH SENSITIVITY)
Troponin I (High Sensitivity): 3 ng/L (ref <18)
Troponin I (High Sensitivity): 3 ng/L (ref <18)

## 2018-08-29 LAB — I-STAT BETA HCG BLOOD, ED (MC, WL, AP ONLY): I-stat hCG, quantitative: <5 m[IU]/mL (ref <5)

## 2018-08-29 MED ORDER — SODIUM CHLORIDE 0.9% FLUSH: 3.0000 mL | Freq: Once | INTRAVENOUS | Status: DC

## 2018-08-29 NOTE — ED Notes (Signed)
Patient verbalized understanding of discharge instructions. Opportunity for questions were provided. Pt. ambulatory and discharged home.  

## 2018-08-29 NOTE — ED Triage Notes (Signed)
Pt reports chest pain for 6 months but worse today when she woke up. Denies any other symptoms. Tested negative for COVID 1 week ago.

## 2018-08-29 NOTE — ED Provider Notes (Signed)
[facility] EMERGENCY DEPARTMENT Provider Note   CSN: [id number] Arrival date & time: 08/29/18  1817     History   Chief Complaint Chief Complaint  Patient presents with  . Chest Pain    HPI Denise Kline is a 33 y.o. female.     The history is provided by the patient. No language interpreter was used.  Chest Pain    33 year old female with history of ventriculoperitoneal shunt, non-smoker, presenting for evaluation of chest pain.  Patient report she has had intermittent heart palpitation and chest discomfort ongoing for the past 6 months but worsened today..  She states intermittently throughout the day she would experiencing sensation of heart racing lasting for about 10 to 15 seconds.  Occasionally she will experience some mild discomfort with chest when this happened.  At times she may feel a bit dizzy and lightheadedness.  The symptoms appearing on almost daily basis.  There are no associated fever chills no productive cough, nausea, vomiting, hemoptysis, abdominal pain, back pain, leg swelling or calf pain.  No prior history of PE or DVT no recent surgery, prolonged bedrest, taking oral birth control pill, recent travel or recent surgery.  She does not have any significant cardiac history.  Patient states she is very careful with what she eats and drink and exercise daily.  She does not smoke.  She denies using any kind of energy drinks or caffeinated beverage.  She was told by her coworker she could have a blood clot and decided to come to the ER for further evaluation.  She denies any increasing stress.  She mentioned having a VP shunt when she was a young child and that is no longer functional.  Was noted that patient tested negative for COVID-19 a week ago.  Past Medical History:  Diagnosis Date  . VP (ventriculoperitoneal) shunt status     There are no active problems to display for this patient.   History reviewed. No pertinent surgical history.    OB History   No obstetric history on file.      Home Medications    Prior to Admission medications   Medication Sig Start Date End Date Taking? Authorizing Provider  acetaminophen (TYLENOL) 500 MG tablet Take 1,000 mg by mouth every 6 (six) hours as needed for mild pain, moderate pain or headache.    [provider]  HYDROcodone-acetaminophen (NORCO/VICODIN) 5-325 MG per tablet Take 1-2 tablets by mouth every 6 (six) hours as needed. Patient not taking: Reported on 06/18/2014 04/16/14   Montine Circle, PA-C  methocarbamol (ROBAXIN) 500 MG tablet Take 1 tablet (500 mg total) by mouth 2 (two) times daily. Patient taking differently: Take 500 mg by mouth 2 (two) times daily as needed for muscle spasms.  06/18/14   Lacretia Leigh, MD  oxyCODONE-acetaminophen (PERCOCET/ROXICET) 5-325 MG per tablet Take 1-2 tablets by mouth every 4 (four) hours as needed for severe pain. 06/18/14   Lacretia Leigh, MD    Family History No family history on file.  Social History Social History   Tobacco Use  . Smoking status: Never Smoker  Substance Use Topics  . Alcohol use: No  . Drug use: No     Allergies   Aspirin   Review of Systems Review of Systems  Cardiovascular: Positive for chest pain.  All other systems reviewed and are negative.    Physical Exam Updated Vital Signs BP 108/61   Pulse 74   Temp 98.5 F (36.9 C) (Oral)  Resp 16   LMP 08/25/2018   SpO2 100%   Physical Exam Vitals signs and nursing note reviewed.  Constitutional:      General: She is not in acute distress.    Appearance: She is well-developed.  HENT:     Head: Atraumatic.  Eyes:     Conjunctiva/sclera: Conjunctivae normal.  Neck:     Musculoskeletal: Neck supple.  Cardiovascular:     Rate and Rhythm: Normal rate and regular rhythm.     Pulses: Normal pulses.     Heart sounds: Normal heart sounds.  Pulmonary:     Effort: Pulmonary effort is normal.     Breath sounds: Normal breath sounds.   Abdominal:     Palpations: Abdomen is soft.     Tenderness: There is no abdominal tenderness.  Musculoskeletal:        General: No swelling or tenderness.  Skin:    Findings: No rash.  Neurological:     Mental Status: She is alert and oriented to person, place, and time.  Psychiatric:        Mood and Affect: Mood normal.      ED Treatments / Results  Labs (all labs ordered are listed, but only abnormal results are displayed) Labs Reviewed  BASIC METABOLIC PANEL - Abnormal; Notable for the following components:      Result Value   Potassium 3.3 (*)    CO2 20 (*)    All other components within normal limits  CBC  I-STAT BETA HCG BLOOD, ED ([facility], [facility], [facility] ONLY)  TROPONIN I (HIGH SENSITIVITY)  TROPONIN I (HIGH SENSITIVITY)    EKG EKG Interpretation  Date/Time:  Friday August 29 2018 18:25:46 EDT Ventricular Rate:  89 PR Interval:  140 QRS Duration: 84 QT Interval:  400 QTC Calculation: 486 R Axis:   61 Text Interpretation:  Normal sinus rhythm Confirmed by Virgina NorfolkAdam, [place] [phone]([id number]) on 08/29/2018 7:36:45 PM     Radiology Dg Chest 2 View  Result Date: 08/29/2018 CLINICAL DATA:  Chest pain EXAM: CHEST - 2 VIEW COMPARISON:  March 08, 2017 FINDINGS: Lungs are clear. Heart size and pulmonary vascularity are normal. No adenopathy. No pneumothorax. Shunt catheter is seen medially on the right, unchanged. No bone lesions. IMPRESSION: Ventriculoperitoneal shunt present.  No edema or consolidation. Electronically Signed   By: Bretta BangWilliam  Woodruff III M.D.   On: 08/29/2018 18:55    Procedures Procedures (including critical care time)  Medications Ordered in ED Medications  sodium chloride flush (NS) 0.9 % injection 3 mL (has no administration in time range)     Initial Impression / Assessment and Plan / ED Course  I have reviewed the triage vital signs and the nursing notes.  Pertinent labs & imaging results that were available during my care of the patient were reviewed by me and  considered in my medical decision making (see chart for details).        BP 108/61   Pulse 74   Temp 98.5 F (36.9 C) (Oral)   Resp 16   LMP 08/25/2018   SpO2 100%    Final Clinical Impressions(s) / ED Diagnoses   Final diagnoses:  Palpitations    ED Discharge Orders    None     7:32 PM Patient here with intermittent heart palpitation which has been ongoing for the past 6 months.  She report 1 of her primary concern is potential blood clot although she does not have any significant risk factor for blood clot.  She  is PERC negative.  Chest pain is atypical of ACS.  EKG however does shows prolonged QT with a QTC of 486, and some T wave abnormalities.  Her high-sensitivity troponin is negative.  Chest x-ray without concerning feature.  There is a ventriculoperitoneal shunt present.  Care discussed with Dr. Lockie Molauratolo who have reviewed EKG and did not appreciate any concerning changes.   8:18 PM Evaluation today without any concerning changes.  No concerning arrhythmia.  Vital signs stable.  Patient is afebrile, no hypoxia.  Encourage patient to follow-up with PCP for further evaluation.  May benefit from thyroid check by PCP   Denise Kline, Denise Viner, PA-C 08/29/18 2027    Virgina Norfolkuratolo, Adam, DO 08/30/18 1534

## 2019-01-08 ENCOUNTER — Other Ambulatory Visit: Payer: Self-pay

## 2019-01-08 DIAGNOSIS — Z20822 Contact with and (suspected) exposure to covid-19: Secondary | ICD-10-CM

## 2019-01-09 LAB — NOVEL CORONAVIRUS, NAA: SARS-CoV-2, NAA: DETECTED — AB

## 2019-01-12 ENCOUNTER — Telehealth: Payer: Self-pay | Admitting: *Deleted

## 2019-01-12 NOTE — Telephone Encounter (Signed)
Pt saw results and guideline message on MYChart. Calling back; verified she did she MYChart message with guidelines but did review again with pt. Verbalizes understanding.

## 2019-01-21 ENCOUNTER — Ambulatory Visit: Payer: BC Managed Care – PPO | Attending: Internal Medicine

## 2019-01-21 ENCOUNTER — Other Ambulatory Visit: Payer: BC Managed Care – PPO

## 2019-01-21 DIAGNOSIS — Z20822 Contact with and (suspected) exposure to covid-19: Secondary | ICD-10-CM

## 2019-01-22 LAB — NOVEL CORONAVIRUS, NAA: SARS-CoV-2, NAA: NOT DETECTED

## 2021-08-15 ENCOUNTER — Ambulatory Visit: Payer: BC Managed Care – PPO | Admitting: Family Medicine

## 2021-11-16 ENCOUNTER — Ambulatory Visit
Admission: RE | Admit: 2021-11-16 | Discharge: 2021-11-16 | Disposition: A | Discharge status: Home / Self Care | Payer: 59 | Source: Ambulatory Visit

## 2021-11-16 VITALS — BP 114/78 | HR 97 | Temp 98.7°F | Resp 14

## 2021-11-16 DIAGNOSIS — G8929 Other chronic pain: Secondary | ICD-10-CM

## 2021-11-16 DIAGNOSIS — R42 Dizziness and giddiness: Secondary | ICD-10-CM

## 2021-11-16 DIAGNOSIS — Z982 Presence of cerebrospinal fluid drainage device: Secondary | ICD-10-CM

## 2021-11-16 DIAGNOSIS — R519 Headache, unspecified: Secondary | ICD-10-CM | POA: Diagnosis not present

## 2021-11-16 LAB — POCT URINE PREGNANCY: Preg Test, Ur: NEGATIVE

## 2021-11-16 NOTE — ED Triage Notes (Signed)
Pt presents with c/o dizziness and light headedness that began yesterday. Pt endorses increased sweating.

## 2021-11-16 NOTE — ED Provider Notes (Signed)
[facility] - URGENT CARE C[facility]  Note:  This document was prepared using Conservation officer, historic buildings and may include unintentional dictation errors.  MRN: [id number] DOB: 17-May-1985  Subjective:   Denise Kline is a 36 y.o. female presenting for 1 to 2-week history of intermittent dizziness, lightheadedness, worsening fatigue, lethargy and now having episodes of sweating.  Has a history of depression and anxiety, migraines, congenital hydrocephalus, anemia.  Reports that she has not had her VP shunt checked.  Chart review does show that she had an MRI through [facility] 09/22/2020 without any intracranial abnormality.  No change in size and configuration of the ventricles with ventriculostomy catheter in place.  She has had daily headaches for the better part of 5 years.  These have been managed as migraines with multiple medications depending on the severity of her symptoms.  Denies any active confusion, double vision, vision changes, difficulty with her balance or speech, weakness, numbness or tingling.  No chest pain, heart racing, palpitations.  No history of arrhythmias.  She has previously been diagnosed with ADHD and was managed with Wellbutrin but this medication was stopped and reports that she it was not replaced with another.  She was also previously diagnosed with a seizure disorder but did not get any official testing per patient or medication management for this.  She is currently taking venlafaxine for depression and anxiety.  She does not have a neurologist.  She has been seeing a hospitalist for internal medicine.  She would like to have information for second opinion.  Regarding her headaches, she was previously put on preventative medications but was unable to tolerate them due to the lethargy that it caused.  She is currently hydrating with 2-3 bottles of water daily.  Previously she was drinking a gallon of water daily but this made no change to her headaches.  She also  reports that she drinks a lot of coffee daily, also has energy drinks every day.  She is primarily trying to get energy so that she can go on about her day.  Reports that she has done this for years.  No abdominal pain, nausea, vomiting.  She is not opposed to a pregnancy test.  No smoking, drug use, vaping, marijuana use.  Patient tries to practice really good sleep habits.  One difficulty if she finds that there are many nights where she cannot wind down or feels like she starts to have racing thoughts.  In general she was sleeping about 4 to 5 hours a night for the previous several months but in the past 2 weeks has felt much more lethargic and has been doing 8 to 9 hours a day and still feels like it is not enough.  No current facility-administered medications for this encounter.  Current Outpatient Medications:    venlafaxine XR (EFFEXOR-XR) 150 MG 24 hr capsule, Take 150 mg by mouth daily with breakfast., Disp: , Rfl:    acetaminophen (TYLENOL) 500 MG tablet, Take 1,000 mg by mouth every 6 (six) hours as needed for mild pain, moderate pain or headache. (Patient not taking: Reported on 11/16/2021), Disp: , Rfl:    Allergies  Allergen Reactions   Aspirin     Seizures     Past Medical History:  Diagnosis Date   VP (ventriculoperitoneal) shunt status      History reviewed. No pertinent surgical history.  History reviewed. No pertinent family history.  Social History   Tobacco Use   Smoking status: Never  Substance  Use Topics   Alcohol use: No   Drug use: No    ROS   Objective:   Vitals: BP 114/78 (BP Location: Left Arm)   Pulse 97   Temp 98.7 F (37.1 C) (Oral)   Resp 14   LMP 10/30/2021 (Approximate)   SpO2 97%   Physical Exam Constitutional:      General: She is not in acute distress.    Appearance: Normal appearance. She is well-developed. She is not ill-appearing, toxic-appearing or diaphoretic.  HENT:     Head: Normocephalic and atraumatic.     Nose: Nose  normal.     Mouth/Throat:     Mouth: Mucous membranes are moist.  Eyes:     General: No scleral icterus.       Right eye: No discharge.        Left eye: No discharge.     Extraocular Movements: Extraocular movements intact.  Neck:     Meningeal: Brudzinski's sign and Kernig's sign absent.  Cardiovascular:     Rate and Rhythm: Normal rate and regular rhythm.     Heart sounds: Normal heart sounds. No murmur heard.    No friction rub. No gallop.  Pulmonary:     Effort: Pulmonary effort is normal. No respiratory distress.     Breath sounds: No stridor. No wheezing, rhonchi or rales.  Chest:     Chest wall: No tenderness.  Skin:    General: Skin is warm and dry.  Neurological:     General: No focal deficit present.     Mental Status: She is alert and oriented to person, place, and time.     Cranial Nerves: No cranial nerve deficit, dysarthria or facial asymmetry.     Motor: No weakness or pronator drift.     Coordination: Romberg sign negative. Coordination normal. Finger-Nose-Finger Test and Heel to [facility] Test normal. Rapid alternating movements normal.     Gait: Gait and tandem walk normal.     Deep Tendon Reflexes: Reflexes normal.  Psychiatric:        Attention and Perception: She is attentive. She does not perceive auditory or visual hallucinations.        Mood and Affect: Mood normal. Mood is not anxious, depressed or elated. Affect is not labile, blunt, flat, angry, tearful or inappropriate.        Speech: She is communicative. Speech is not rapid and pressured, delayed, slurred or tangential.        Behavior: Behavior normal. Behavior is not agitated, slowed, aggressive, withdrawn, hyperactive or combative.        Thought Content: Thought content normal. Thought content does not include homicidal or suicidal ideation.        Cognition and Memory: Cognition is not impaired. Memory is not impaired. She does not exhibit impaired recent memory or impaired remote memory.         Judgment: Judgment normal. Judgment is not impulsive or inappropriate.    Results for orders placed or performed during the hospital encounter of 11/16/21 (from the past 24 hour(s))  POCT urine pregnancy     Status: None   Collection Time: 11/16/21  3:01 PM  Result Value Ref Range   Preg Test, Ur Negative Negative    Assessment and Plan :   PDMP not reviewed this encounter.  1. Chronic nonintractable headache, unspecified headache type   2. VP (ventriculoperitoneal) shunt status   3. Dizziness     I had an extensive discussion with patient about  the differential.  I believe she would benefit from being managed in getting more extensive work-up, second opinion from a neurology group.  I placed an internal referral to [facility] neurology.  She also would like to start seeing a new primary care provider as she has a very difficult time getting in with her current internal medicine doctor.  I provided her information to a new practice.  I also advised that she contact the headache wellness center.  Regarding her ADHD, recommended she follow-up with [facility].  I did review general management of her headaches and emphasized need to hydrate consistently and eliminate the energy drinks altogether.  Recommended that she could continue with her coffee but is limited to no more than 16 ounces in a day.  I did not appreciate any signs of an acute encephalopathy from her exam and despite the more acute symptoms of lethargy and sweating, the headaches are chronic in nature and therefore we will defer an ER visit.  Furthermore the MRI that was done recently did not show any particular intracranial abnormality.  We discussed use of Excedrin over-the-counter for headaches as well.  Counseled patient on potential for adverse effects with medications prescribed/recommended today, ER and return-to-clinic precautions discussed, patient verbalized understanding.    Wallis Bamberg, PA-C 11/16/21  1542

## 2021-11-17 ENCOUNTER — Telehealth: Payer: Self-pay | Admitting: Emergency Medicine

## 2021-11-17 LAB — CBC
Hematocrit: 44.7 % (ref 34.0–46.6)
Hemoglobin: 14.6 g/dL (ref 11.1–15.9)
MCH: 29 pg (ref 26.6–33.0)
MCHC: 32.7 g/dL (ref 31.5–35.7)
MCV: 89 fL (ref 79–97)
Platelets: 362 10*3/uL (ref 150–450)
RBC: 5.04 x10E6/uL (ref 3.77–5.28)
RDW: 12.3 % (ref 11.7–15.4)
WBC: 8.2 10*3/uL (ref 3.4–10.8)

## 2021-11-17 LAB — COMPREHENSIVE METABOLIC PANEL
ALT: 53 IU/L — ABNORMAL HIGH (ref 0–32)
AST: 28 IU/L (ref 0–40)
Albumin/Globulin Ratio: 1.4 (ref 1.2–2.2)
Albumin: 4.6 g/dL (ref 3.9–4.9)
Alkaline Phosphatase: 94 IU/L (ref 44–121)
BUN/Creatinine Ratio: 8 — ABNORMAL LOW (ref 9–23)
BUN: 6 mg/dL (ref 6–20)
Bilirubin Total: <0.2 mg/dL (ref 0.0–1.2)
CO2: 19 mmol/L — ABNORMAL LOW (ref 20–29)
Calcium: 9.7 mg/dL (ref 8.7–10.2)
Chloride: 104 mmol/L (ref 96–106)
Creatinine, Ser: 0.77 mg/dL (ref 0.57–1.00)
Globulin, Total: 3.3 g/dL (ref 1.5–4.5)
Glucose: 134 mg/dL — ABNORMAL HIGH (ref 70–99)
Potassium: 4 mmol/L (ref 3.5–5.2)
Sodium: 143 mmol/L (ref 134–144)
Total Protein: 7.9 g/dL (ref 6.0–8.5)
eGFR: 102 mL/min/{1.73_m2} (ref >59)

## 2021-11-17 LAB — TSH: TSH: 1.41 u[IU]/mL (ref 0.450–4.500)

## 2021-11-17 NOTE — Telephone Encounter (Signed)
Received a voicemail from patient requesting a review of all abnormals on her blood work.  Reviewed with Dr. Susa Simmonds who states "She is not pregnant. Her thyroid is functioning normally. She has not blood infection or inflammation. She is not anemic. Her blood sugar is somewhat elevated. Unsure, if she ate just prior to blood work or not. If not, she will need to follow up with her PCP to discuss screening for diabetes. She had evidence of liver inflammation but very mildly. Her BUN and CO2 are out of range but likely would fall into range if repeated. Her PCP can recheck that for her. "  Attempted to reach patient x 1, unable to LVM

## 2022-04-18 ENCOUNTER — Emergency Department: Payer: Commercial Managed Care - PPO

## 2022-04-18 ENCOUNTER — Emergency Department
Admission: EM | Admit: 2022-04-18 | Discharge: 2022-04-18 | Disposition: A | Discharge status: Home / Self Care | Payer: Commercial Managed Care - PPO | Attending: Emergency Medicine | Admitting: Emergency Medicine

## 2022-04-18 ENCOUNTER — Encounter: Payer: Self-pay | Admitting: Emergency Medicine

## 2022-04-18 ENCOUNTER — Other Ambulatory Visit: Payer: Self-pay

## 2022-04-18 DIAGNOSIS — R0789 Other chest pain: Secondary | ICD-10-CM | POA: Insufficient documentation

## 2022-04-18 LAB — BASIC METABOLIC PANEL
Anion gap: 12 (ref 5–15)
BUN: 9 mg/dL (ref 6–20)
CO2: 24 mmol/L (ref 22–32)
Calcium: 8.9 mg/dL (ref 8.9–10.3)
Chloride: 103 mmol/L (ref 98–111)
Creatinine, Ser: 0.86 mg/dL (ref 0.44–1.00)
GFR, Estimated: >60 mL/min (ref >60)
Glucose, Bld: 157 mg/dL — ABNORMAL HIGH (ref 70–99)
Potassium: 3.6 mmol/L (ref 3.5–5.1)
Sodium: 139 mmol/L (ref 135–145)

## 2022-04-18 LAB — CBC
HCT: 44 % (ref 36.0–46.0)
Hemoglobin: 13.9 g/dL (ref 12.0–15.0)
MCH: 28.7 pg (ref 26.0–34.0)
MCHC: 31.6 g/dL (ref 30.0–36.0)
MCV: 90.7 fL (ref 80.0–100.0)
Platelets: 300 10*3/uL (ref 150–400)
RBC: 4.85 MIL/uL (ref 3.87–5.11)
RDW: 13 % (ref 11.5–15.5)
WBC: 6.4 10*3/uL (ref 4.0–10.5)
nRBC: 0 % (ref 0.0–0.2)

## 2022-04-18 LAB — POC URINE PREG, ED: Preg Test, Ur: NEGATIVE

## 2022-04-18 LAB — TROPONIN I (HIGH SENSITIVITY)
Troponin I (High Sensitivity): <2 ng/L (ref <18)
Troponin I (High Sensitivity): <2 ng/L (ref <18)

## 2022-04-18 NOTE — ED Triage Notes (Signed)
Patient to ED for left sided chest pain - under breast. Pain radiates into neck and left arm. Pain started around 10am. Denies cardiac hx.

## 2022-04-18 NOTE — Discharge Instructions (Signed)
You will get a phone call from [facility].  I feel that you do need to be reevaluated for the left-sided chest pain.  If you have not heard from them in 2 to 3 days, please call for an appointment. Return emergency department if worsening

## 2022-04-18 NOTE — ED Provider Notes (Signed)
[facility] Provider Note    Event Date/Time   First MD Initiated Contact with Patient 04/18/22 1058     (approximate)   History   Chest Pain   HPI  Denise Kline is a 37 y.o. female with history of VP shunt PRESENTS emergency department complaining of left-sided chest pain that radiates from underneath her breast into her neck and down her left arm.  Symptoms started around 10 AM.  States similar symptoms happened in 2016 and everything was negative.  The pain comes and goes in waves.  Family history of heart disease, her father had a MI at 1.  Patient is a non-smoker.  No known injury.  No nausea or vomiting with pain      Physical Exam   Triage Vital Signs: ED Triage Vitals [04/18/22 1039]  Enc Vitals Group     BP 120/71     Pulse Rate 92     Resp 18     Temp 98.6 F (37 C)     Temp Source Oral     SpO2 95 %     Weight      Height      Head Circumference      Peak Flow      Pain Score 3     Pain Loc      Pain Edu?      Excl. in [place]?     Most recent vital signs: Vitals:   04/18/22 1039  BP: 120/71  Pulse: 92  Resp: 18  Temp: 98.6 F (37 C)  SpO2: 95%     General: Awake, no distress.   CV:  Good peripheral perfusion. regular rate and  rhythm Resp:  Normal effort. Lungs cta Abd:  No distention.   Other:      ED Results / Procedures / Treatments   Labs (all labs ordered are listed, but only abnormal results are displayed) Labs Reviewed  BASIC METABOLIC PANEL - Abnormal; Notable for the following components:      Result Value   Glucose, Bld 157 (*)    All other components within normal limits  CBC  POC URINE PREG, ED  TROPONIN I (HIGH SENSITIVITY)  TROPONIN I (HIGH SENSITIVITY)     EKG  EKG   RADIOLOGY Chest x-ray    PROCEDURES:   Procedures   MEDICATIONS ORDERED IN ED: Medications - No data to display   IMPRESSION / MDM / [place] / ED COURSE  I reviewed the triage vital signs and  the nursing notes.                              Differential diagnosis includes, but is not limited to, MI, angina, esophagitis, cervical radiculopathy  Patient's presentation is most consistent with acute presentation with potential threat to life or bodily function.   Labs and imaging along with EKG ordered  EKG is reassuring with normal sinus rhythm, see physician read  Chest x-ray independently reviewed and interpreted by me as being negative for any acute abnormality.  Labs are reassuring, will await second troponin as symptoms started at 10 AM   Second troponin is reassuring.  Do not feel that the patient had a MI.  Feels this more atypical chest pain.  She appears to be hemodynamically stable.  Will give her referral to cardiology.  Patient is in agreement treatment plan.  She was discharged stable condition.  FINAL CLINICAL IMPRESSION(S) / ED DIAGNOSES   Final diagnoses:  Atypical chest pain     Rx / DC Orders   ED Discharge Orders          Ordered    Ambulatory referral to Cardiology       Comments: If you have not heard from the Cardiology office within the next 72 hours please call [phone].   04/18/22 1528             Note:  This document was prepared using Dragon voice recognition software and may include unintentional dictation errors.    Versie Starks, PA-C 04/18/22 1558    Harvest Dark, MD 04/19/22 2133

## 2022-04-18 NOTE — ED Notes (Signed)
See triage note. Pt complains of intermittent sharp L sided chest pain that radiates to L shoulder and arm. Pt had similar episode this past week. Pain started again today around 1000 but has subsided at this moment. States when pain comes, she becomes dizzy and is diaphoretic. Denies inspiratory pain. Provider saw pt at bedside. Skin is dry. Placed on cardiac monitor.

## 2022-04-18 NOTE — ED Provider Notes (Incomplete)
-----------------------------------------   2:02 PM on 04/18/2022 -----------------------------------------

## 2023-10-22 ENCOUNTER — Other Ambulatory Visit: Payer: Self-pay

## 2023-10-22 ENCOUNTER — Inpatient Hospital Stay
Admission: EM | Admit: 2023-10-22 | Discharge: 2023-10-25 | DRG: 552 | Disposition: A | Discharge status: Home / Self Care | Attending: Internal Medicine | Admitting: Internal Medicine

## 2023-10-22 ENCOUNTER — Emergency Department

## 2023-10-22 DIAGNOSIS — Z886 Allergy status to analgesic agent status: Secondary | ICD-10-CM | POA: Diagnosis not present

## 2023-10-22 DIAGNOSIS — E66813 Obesity, class 3: Secondary | ICD-10-CM | POA: Diagnosis present

## 2023-10-22 DIAGNOSIS — Z1152 Encounter for screening for COVID-19: Secondary | ICD-10-CM

## 2023-10-22 DIAGNOSIS — G43909 Migraine, unspecified, not intractable, without status migrainosus: Secondary | ICD-10-CM | POA: Diagnosis present

## 2023-10-22 DIAGNOSIS — Z6841 Body Mass Index (BMI) 40.0 and over, adult: Secondary | ICD-10-CM | POA: Diagnosis not present

## 2023-10-22 DIAGNOSIS — Z8669 Personal history of other diseases of the nervous system and sense organs: Secondary | ICD-10-CM | POA: Diagnosis not present

## 2023-10-22 DIAGNOSIS — G039 Meningitis, unspecified: Secondary | ICD-10-CM | POA: Diagnosis present

## 2023-10-22 DIAGNOSIS — R42 Dizziness and giddiness: Secondary | ICD-10-CM | POA: Diagnosis present

## 2023-10-22 DIAGNOSIS — G40909 Epilepsy, unspecified, not intractable, without status epilepticus: Secondary | ICD-10-CM | POA: Diagnosis present

## 2023-10-22 DIAGNOSIS — Q039 Congenital hydrocephalus, unspecified: Secondary | ICD-10-CM | POA: Diagnosis not present

## 2023-10-22 DIAGNOSIS — Z79899 Other long term (current) drug therapy: Secondary | ICD-10-CM

## 2023-10-22 DIAGNOSIS — Z982 Presence of cerebrospinal fluid drainage device: Secondary | ICD-10-CM

## 2023-10-22 DIAGNOSIS — M5030 Other cervical disc degeneration, unspecified cervical region: Secondary | ICD-10-CM | POA: Diagnosis present

## 2023-10-22 DIAGNOSIS — R569 Unspecified convulsions: Secondary | ICD-10-CM | POA: Diagnosis not present

## 2023-10-22 DIAGNOSIS — M542 Cervicalgia: Secondary | ICD-10-CM | POA: Diagnosis not present

## 2023-10-22 DIAGNOSIS — F32A Depression, unspecified: Secondary | ICD-10-CM | POA: Diagnosis present

## 2023-10-22 DIAGNOSIS — R519 Headache, unspecified: Secondary | ICD-10-CM | POA: Diagnosis not present

## 2023-10-22 DIAGNOSIS — E669 Obesity, unspecified: Secondary | ICD-10-CM | POA: Insufficient documentation

## 2023-10-22 DIAGNOSIS — F325 Major depressive disorder, single episode, in full remission: Secondary | ICD-10-CM | POA: Diagnosis not present

## 2023-10-22 LAB — LACTIC ACID, PLASMA
Lactic Acid, Venous: 1.2 mmol/L (ref 0.5–1.9)
Lactic Acid, Venous: 1.9 mmol/L (ref 0.5–1.9)

## 2023-10-22 LAB — COMPREHENSIVE METABOLIC PANEL WITH GFR
ALT: 47 U/L — ABNORMAL HIGH (ref 0–44)
AST: 34 U/L (ref 15–41)
Albumin: 4.2 g/dL (ref 3.5–5.0)
Alkaline Phosphatase: 108 U/L (ref 38–126)
Anion gap: 10 (ref 5–15)
BUN: 8 mg/dL (ref 6–20)
CO2: 23 mmol/L (ref 22–32)
Calcium: 9.3 mg/dL (ref 8.9–10.3)
Chloride: 104 mmol/L (ref 98–111)
Creatinine, Ser: 1.14 mg/dL — ABNORMAL HIGH (ref 0.44–1.00)
GFR, Estimated: >60 mL/min
Glucose, Bld: 107 mg/dL — ABNORMAL HIGH (ref 70–99)
Potassium: 3.6 mmol/L (ref 3.5–5.1)
Sodium: 137 mmol/L (ref 135–145)
Total Bilirubin: 0.4 mg/dL (ref 0.0–1.2)
Total Protein: 8.7 g/dL — ABNORMAL HIGH (ref 6.5–8.1)

## 2023-10-22 LAB — URINALYSIS, ROUTINE W REFLEX MICROSCOPIC
Bilirubin Urine: NEGATIVE
Glucose, UA: NEGATIVE mg/dL
Ketones, ur: NEGATIVE mg/dL
Leukocytes,Ua: NEGATIVE
Nitrite: NEGATIVE
Protein, ur: NEGATIVE mg/dL
Specific Gravity, Urine: 1.012 (ref 1.005–1.030)
pH: 7 (ref 5.0–8.0)

## 2023-10-22 LAB — RESP PANEL BY RT-PCR (RSV, FLU A&B, COVID)  RVPGX2
Influenza A by PCR: NEGATIVE
Influenza B by PCR: NEGATIVE
Resp Syncytial Virus by PCR: NEGATIVE
SARS Coronavirus 2 by RT PCR: NEGATIVE

## 2023-10-22 LAB — CBC
HCT: 48.1 % — ABNORMAL HIGH (ref 36.0–46.0)
Hemoglobin: 14.8 g/dL (ref 12.0–15.0)
MCH: 27.3 pg (ref 26.0–34.0)
MCHC: 30.8 g/dL (ref 30.0–36.0)
MCV: 88.6 fL (ref 80.0–100.0)
Platelets: 344 K/uL (ref 150–400)
RBC: 5.43 MIL/uL — ABNORMAL HIGH (ref 3.87–5.11)
RDW: 13.4 % (ref 11.5–15.5)
WBC: 12.4 K/uL — ABNORMAL HIGH (ref 4.0–10.5)
nRBC: 0 % (ref 0.0–0.2)

## 2023-10-22 MED ORDER — BUTALBITAL-APAP-CAFFEINE 50-325-40 MG PO TABS
1.0000 | ORAL_TABLET | ORAL | Status: DC | PRN
  Administered 2023-10-22: 1 via ORAL
  Filled 2023-10-22: qty 1

## 2023-10-22 MED ORDER — HYDROCORTISONE 1 % EX CREA
TOPICAL_CREAM | CUTANEOUS | Status: DC | PRN
  Filled 2023-10-22: qty 28

## 2023-10-22 MED ORDER — VANCOMYCIN HCL 750 MG/150ML IV SOLN
750.0000 mg | Freq: Two times a day (BID) | INTRAVENOUS | Status: DC
  Administered 2023-10-23: 750 mg via INTRAVENOUS
  Filled 2023-10-22 (×2): qty 150

## 2023-10-22 MED ORDER — VANCOMYCIN HCL IN DEXTROSE 1-5 GM/200ML-% IV SOLN
1000.0000 mg | Freq: Once | INTRAVENOUS | Status: DC
  Filled 2023-10-22: qty 200

## 2023-10-22 MED ORDER — ONDANSETRON HCL 4 MG PO TABS: 4.0000 mg | ORAL_TABLET | Freq: Four times a day (QID) | ORAL | Status: DC | PRN

## 2023-10-22 MED ORDER — SODIUM CHLORIDE 0.9 % IV BOLUS
1000.0000 mL | Freq: Once | INTRAVENOUS | Status: AC
  Administered 2023-10-22: 1000 mL via INTRAVENOUS

## 2023-10-22 MED ORDER — VANCOMYCIN HCL 750 MG/150ML IV SOLN
750.0000 mg | Freq: Once | INTRAVENOUS | Status: AC
Start: 2023-10-22 — End: 2023-10-23
  Administered 2023-10-22: 750 mg via INTRAVENOUS
  Filled 2023-10-22: qty 150

## 2023-10-22 MED ORDER — VANCOMYCIN HCL 1500 MG/300ML IV SOLN
1500.0000 mg | Freq: Once | INTRAVENOUS | Status: AC
  Administered 2023-10-22: 1500 mg via INTRAVENOUS
  Filled 2023-10-22: qty 300

## 2023-10-22 MED ORDER — OXYCODONE HCL 5 MG PO TABS
5.0000 mg | ORAL_TABLET | ORAL | Status: DC | PRN
  Administered 2023-10-22 – 2023-10-25 (×5): 5 mg via ORAL
  Filled 2023-10-22 (×6): qty 1

## 2023-10-22 MED ORDER — FENTANYL CITRATE PF 50 MCG/ML IJ SOSY
50.0000 ug | PREFILLED_SYRINGE | Freq: Once | INTRAMUSCULAR | Status: AC
  Administered 2023-10-22: 50 ug via INTRAVENOUS
  Filled 2023-10-22: qty 1

## 2023-10-22 MED ORDER — MECLIZINE HCL 25 MG PO TABS
25.0000 mg | ORAL_TABLET | Freq: Three times a day (TID) | ORAL | Status: AC
Start: 2023-10-22 — End: 2023-10-24
  Administered 2023-10-22 – 2023-10-24 (×6): 25 mg via ORAL
  Filled 2023-10-22 (×7): qty 1

## 2023-10-22 MED ORDER — SODIUM CHLORIDE 0.9 % IV SOLN
1.0000 g | Freq: Once | INTRAVENOUS | Status: AC
  Administered 2023-10-22: 1 g via INTRAVENOUS
  Filled 2023-10-22: qty 10

## 2023-10-22 MED ORDER — VENLAFAXINE HCL ER 150 MG PO CP24
150.0000 mg | ORAL_CAPSULE | Freq: Every day | ORAL | Status: DC
  Administered 2023-10-23 – 2023-10-25 (×3): 150 mg via ORAL
  Filled 2023-10-22 (×3): qty 1

## 2023-10-22 MED ORDER — SODIUM CHLORIDE 0.9 % IV SOLN
2.0000 g | Freq: Two times a day (BID) | INTRAVENOUS | Status: DC
Start: 2023-10-23 — End: 2023-10-23
  Administered 2023-10-23 (×2): 2 g via INTRAVENOUS
  Filled 2023-10-22 (×2): qty 20

## 2023-10-22 MED ORDER — ONDANSETRON HCL 4 MG/2ML IJ SOLN: 4.0000 mg | Freq: Four times a day (QID) | INTRAMUSCULAR | Status: DC | PRN

## 2023-10-22 MED ORDER — SODIUM CHLORIDE 0.9 % IV SOLN: INTRAVENOUS | Status: AC

## 2023-10-22 NOTE — Progress Notes (Signed)
   10/22/23 2002  Assess: MEWS Score  Temp 99.1 F (37.3 C)  BP 137/86  MAP (mmHg) 101  Pulse Rate (!) 120  Resp 19  SpO2 100 %  O2 Device Room Air  Assess: MEWS Score  MEWS Temp 0  MEWS Systolic 0  MEWS Pulse 2  MEWS RR 0  MEWS LOC 0  MEWS Score 2  MEWS Score Color Yellow  Assess: if the MEWS score is Yellow or Red  Were vital signs accurate and taken at a resting state? Yes  Does the patient meet 2 or more of the SIRS criteria? Yes  Does the patient have a confirmed or suspected source of infection? No  MEWS guidelines implemented  Yes, yellow  Treat  MEWS Interventions Considered administering scheduled or prn medications/treatments as ordered  Take Vital Signs  Increase Vital Sign Frequency  Yellow: Q2hr x1, continue Q4hrs until patient remains green for 12hrs  Escalate  MEWS: Escalate Yellow: Discuss with charge nurse and consider notifying provider and/or RRT  Notify: Charge Nurse/RN  Name of Charge Nurse/RN Notified Benessa  Assess: SIRS CRITERIA  SIRS Temperature  0  SIRS Respirations  0  SIRS Pulse 1  SIRS WBC 0  SIRS Score Sum  1   Patient is admitted for suspected meningitis infection. Patient has no cardiac history. Patient is on telemetry. Heart increased with exertion. Patient has an elevated temperature, that does not require medicinal treatment at this time.

## 2023-10-22 NOTE — ED Notes (Signed)
 Pa in meds given before LP attempt.  Dr floy was unsuccessful obtaining a specimen from LP.  Pt tolerated well.  Iv fluids infusing.

## 2023-10-22 NOTE — Progress Notes (Signed)
 Pharmacy Antibiotic Note  Denise Kline is a 38 y.o. female admitted on 10/22/2023 with meningitis. PMH significant for congenital hydrocephalus status post VP shunt which is currently nonfunctional, history of migraine headaches, obesity. Pharmacy has been consulted for Vancomycin  dosing.  9/23: LP planned for the AM, Scr 1.14 (BL < 1). Will continue to monitor  Plan: - Will give a Vancomycin  2250 mg IV loading dose once - Will start vancomycin  maintenance dose at 750 mg q12h  - Goal trough level 20-25  - Will dose per levels since meningitis  - Will check a vancomycin  trough level at steady state before 3rd or 4th dose  - Also on Ceftriaxone  2 g q12h       Temp (24hrs), Avg:99.1 F (37.3 C), Min:98.5 F (36.9 C), Max:100.2 F (37.9 C)  Recent Labs  Lab 10/22/23 1424 10/22/23 1606  WBC 12.4*  --   CREATININE 1.14*  --   LATICACIDVEN  --  1.2    CrCl cannot be calculated (Unknown ideal weight.).    Allergies  Allergen Reactions   Aspirin     Seizures     Antimicrobials this admission: CRO 9/23 >>  Vancomycin  9/23 >>   Dose adjustments this admission:   Microbiology results: 9/23 BCx: ordered   Thank you for allowing pharmacy to be a part of this patient's care.   Ransom Blanch PGY-1 Pharmacy Resident  [facility]  10/22/2023 7:42 PM

## 2023-10-22 NOTE — ED Provider Notes (Signed)
 [facility] Provider Note    Event Date/Time   First MD Initiated Contact with Patient 10/22/23 1504     (approximate)   History   Dizziness   HPI  Denise Kline is a 38 y.o. female  who presents to the emergency department today because of concerns for neck pain and dizziness.  The neck pain started yesterday.  She states it is severe.  She does have limited range of motion.  She had somewhat similar pain once in the past although it was not as severe.  At that time she saw her primary care thought it was arthritis and it did improve on its own.  However this is more severe.  Additionally the patient started developing dizziness today.  She had not appreciated any fevers or chills.  She did have contact with a family member who has been sick recently.     Physical Exam   Triage Vital Signs: ED Triage Vitals  Encounter Vitals Group     BP 10/22/23 1410 (!) 172/144     Girls Systolic BP Percentile --      Girls Diastolic BP Percentile --      Boys Systolic BP Percentile --      Boys Diastolic BP Percentile --      Pulse Rate 10/22/23 1410 (!) 114     Resp 10/22/23 1410 18     Temp 10/22/23 1410 100.2 F (37.9 C)     Temp Source 10/22/23 1447 Oral     SpO2 10/22/23 1410 99 %     Weight --      Height --      Head Circumference --      Peak Flow --      Pain Score --      Pain Loc --      Pain Education --      Exclude from Growth Chart --     Most recent vital signs: Vitals:   10/22/23 1445 10/22/23 1447  BP: 124/84   Pulse: (!) 103   Resp: 12   Temp:  98.7 F (37.1 C)  SpO2: 99%    General: Awake, alert, oriented. CV:  Good peripheral perfusion. Tachycardia. Resp:  Normal effort. Lungs clear. Abd:  No distention.  Other:  Significant pain and decreased range of motion when attempting to touch chin to chest. Positive kernigs sign   ED Results / Procedures / Treatments   Labs (all labs ordered are listed, but only abnormal  results are displayed) Labs Reviewed  COMPREHENSIVE METABOLIC PANEL WITH GFR - Abnormal; Notable for the following components:      Result Value   Glucose, Bld 107 (*)    Creatinine, Ser 1.14 (*)    Total Protein 8.7 (*)    ALT 47 (*)    All other components within normal limits  CBC - Abnormal; Notable for the following components:   WBC 12.4 (*)    RBC 5.43 (*)    HCT 48.1 (*)    All other components within normal limits  URINALYSIS, ROUTINE W REFLEX MICROSCOPIC  CBG MONITORING, ED  POC URINE PREG, ED     EKG  I, Guadalupe Eagles, attending physician, personally viewed and interpreted this EKG  EKG Time: 1418 Rate: 117 Rhythm: sinus tachycardia Axis: normal Intervals: qtc 429 QRS: narrow ST changes: no st elevation Impression: abnormal ekg   RADIOLOGY I independently interpreted and visualized the CT head. My interpretation: No ICH, shunt in  place.  Radiology interpretation:  IMPRESSION:  1. No acute findings.  2. Stable right parietal ventriculostomy catheter with tip over the  midline of the right lateral ventricle. Lateral ventricles are  asymmetric with the left larger than right unchanged from the prior  exam.    PROCEDURES:  Critical Care performed: No    MEDICATIONS ORDERED IN ED: Medications - No data to display   IMPRESSION / MDM / ASSESSMENT AND PLAN / ED COURSE  I reviewed the triage vital signs and the nursing notes.                              Differential diagnosis includes, but is not limited to, viral illness, meningitis, cervical disc disease  Patient's presentation is most consistent with acute presentation with potential threat to life or bodily function.  Patient presented to the emergency department today with concerns for dizziness severe neck pain.  Patient initially had temperature of 100.2 here.  Patient was tachycardic.  Blood work with slight leukocytosis.  Physical exam is concerning for possible meningitis.  I did reach  out to neurosurgery given patient with VP shunt to see if it would make sense to attempt to take fluid from the shunt reservoir.  At this time they felt that lumbar puncture would be a better test.  Did raise concern about introducing infection if they attempted to draw from the reservoir.  I did discuss with the patient.  Did consent patient for lumbar puncture.  However lumbar puncture was unsuccessful here in the emergency department.  Will start empiric antibiotics.  Discussed with Dr. Lanetta with the hospitalist service who will evaluate for admission.     FINAL CLINICAL IMPRESSION(S) / ED DIAGNOSES   Final diagnoses:  Dizziness  Neck pain     Note:  This document was prepared using Dragon voice recognition software and may include unintentional dictation errors.    Floy Roberts, MD 10/22/23 [phone]

## 2023-10-22 NOTE — Assessment & Plan Note (Signed)
 Lifestyle modification and exercise has been discussed with patient in detail She verbalizes understanding and agrees to the plan

## 2023-10-22 NOTE — H&P (Addendum)
 History and Physical    Patient: Denise Kline FMW:[id number] DOB: [birth year] DOA: 10/22/2023 DOS: the patient was seen and examined on 10/22/2023 PCP: [facility]  Patient coming from: Home  Chief Complaint:  Chief Complaint  Patient presents with   Dizziness   HPI: Denise Kline is a 38 y.o. 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  Pain is associated with dizziness but she denies having any tinnitus.  Dizziness is worse with any form of movement of her head.  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  She complains of daily headaches involving the frontal as well as temporal areas.  Due to the persistence of her symptoms she presented to the ER for further evaluation.  She notes that she has had subjective fever and chills at home. Upon arrival to the ER she was noted to have a Tmax of 100.2 and she was tachycardic Abnormal labs include a white count of 12.4 CT scan of the head without contrast shows no acute findings. Stable right parietal ventriculostomy catheter with tip over the midline of the right lateral ventricle. Lateral ventricles are asymmetric with the left larger than right unchanged from the prior exam. Chart review shows that patient was seen by neurosurgery in 2019 and had a nuclear medicine study performed at the time which revealed no flow through her catheter tube.  Patient subsequently developed increased headaches, migraine, balance and memory problems and an MRI was done which showed decompressed ventricles but that the patient had fluid tracking along the shunt pathway  concerning for elevated pressure.  Recommendation was made to revise the patient's shunt but she appears to have been lost to follow-up. ER physician attempted a lumbar puncture in the ER but was unsuccessful Due to patient's low-grade fever, leukocytosis and severe neck pain there was a concern for possible meningitis and so patient was started on Rocephin  and vancomycin  and will be admitted to the hospital for further evaluation.    Review of Systems: As mentioned in the history of present illness. All other systems reviewed and are negative. Past Medical History:  Diagnosis Date   VP (ventriculoperitoneal) shunt status    No past surgical history on file. Social History:  reports that she has never smoked. She does not have any smokeless tobacco history on file. She reports that she does not drink alcohol and does not use drugs.  Allergies  Allergen Reactions   Aspirin     Seizures     No family history on file.  Prior to Admission medications   Medication Sig Start Date End Date Taking? Authorizing Provider  acetaminophen  (TYLENOL ) 500 MG tablet Take 1,000 mg by mouth every 6 (six) hours as needed for mild pain, moderate pain or headache. Patient not taking: Reported on 11/16/2021    [provider]  venlafaxine  XR (EFFEXOR -XR) 150 MG 24 hr capsule Take 150 mg by mouth daily with breakfast.    [provider]    Physical Exam: Vitals:   10/22/23 1710 10/22/23 1740 10/22/23 1815 10/22/23 1841  BP: 136/80   (!) 142/86  Pulse: (!) 109 (!) 138 ROLLEN)  105 (!) 108  Resp: 19 (!) 22 18   Temp:    98.5 F (36.9 C)  TempSrc:    Oral  SpO2: 98% 100% 100% 100%   Physical Exam Vitals and nursing note reviewed.  Constitutional:      Appearance: She is obese.     Comments: Appears uncomfortable  HENT:     Head: Normocephalic and atraumatic.     Nose: Nose normal.     Mouth/Throat:     Mouth: Mucous membranes are moist.  Eyes:     Conjunctiva/sclera:  Conjunctivae normal.  Neck:     Comments: Cervical tenderness Cardiovascular:     Rate and Rhythm: Tachycardia present.  Pulmonary:     Effort: Pulmonary effort is normal.     Breath sounds: Normal breath sounds.  Abdominal:     General: Bowel sounds are normal.     Palpations: Abdomen is soft.     Comments: Central adiposity  Musculoskeletal:        General: Normal range of motion.  Skin:    General: Skin is warm and dry.  Neurological:     General: No focal deficit present.     Mental Status: She is oriented to person, place, and time.  Psychiatric:        Mood and Affect: Mood normal.        Behavior: Behavior normal.     Data Reviewed: Data Reviewed: Relevant notes from primary care and specialist visits, past discharge summaries as available in EHR, including Care Everywhere. Prior diagnostic testing as pertinent to current admission diagnoses Updated medications and problem lists for reconciliation ED course, including vitals, labs, imaging, treatment and response to treatment Triage notes, nursing and pharmacy notes and ED provider's notes Notable results as noted in HPI Sodium 137, potassium 3.6, chloride 104, bicarb 23, glucose 107, BUN 8, creatinine 1.14, calcium 9.3, total protein 8.7, albumin 4.2, AST 34, ALT 47, alkaline phosphatase 108, total bilirubin 0.4, white count 12.4, hemoglobin 14.8, platelet count 343 Cervical spine x-ray shows right parieto-occipital approach ventriculoperitoneal shunt catheter tip terminates over the expected location of the medial right lateral ventricle. Long segment discontinuity of the shunt catheter spanning 7.3 cm from the level of C2-C6. Abdominal x-ray shows . No acute abdominal findings. Abandoned VP shunt tubing fragment overlies the right sacrum Twelve-lead EKG reviewed by me shows sinus tachycardia  Labs reviewed  Assessment and Plan: * Neck pain Patient with a known history of degenerative disc disease involving the  cervical spine who presents to the emergency room for evaluation of worsening neck pain and was found to have a fever with a Tmax of 100.2 and mild leukocytosis. Patient also noted to have concerns for meningeal signs and an LP was attempted in the ER without success. IR consult for LP in a.m. Place patient on droplet precautions Continue IV Rocephin  and vancomycin  for suspected meningitis Consult infectious disease for further evaluation  Meningitis Treatment as outlined in 1  Depression Continue venlafaxine   Congenital hydrocephalus (HCC) Patient has a history of congenital hydrocephalus and has a nonfunctioning VP shunt Due to complaints of headaches, memory difficulties and balance issues concerning for elevated intracranial pressure, revision of the shunt was recommended by neurosurgery as an outpatient. Will consult neurosurgery for further recommendation  History of migraine headaches Patient with chronic daily headaches and also has a history of migraine headaches She has a nonfunctioning VP shunt and due to her symptoms of headaches, memory and balance issues, revision of  the shunt was recommended by neurosurgery several years ago Will start patient on Fioricet  Consult neurosurgery for further recommendation  Obesity (BMI 30-39.9) Lifestyle modification and exercise has been discussed with patient in detail She verbalizes understanding and agrees to the plan      Advance Care Planning:   Code Status: Full Code   Consults: Infectious disease, neurosurgery  Family Communication: Plan of care discussed with patient and her significant other at the bedside.  They verbalized understanding and agree with the plan.  Severity of Illness: The appropriate patient status for this patient is INPATIENT. Inpatient status is judged to be reasonable and necessary in order to provide the required intensity of service to ensure the patient's safety. The patient's presenting symptoms,  physical exam findings, and initial radiographic and laboratory data in the context of their chronic comorbidities is felt to place them at high risk for further clinical deterioration. Furthermore, it is not anticipated that the patient will be medically stable for discharge from the hospital within 2 midnights of admission.   * I certify that at the point of admission it is my clinical judgment that the patient will require inpatient hospital care spanning beyond 2 midnights from the point of admission due to high intensity of service, high risk for further deterioration and high frequency of surveillance required.*  Author: Aimee Somerset, MD 10/22/2023 7:15 PM  For on call review [email or web address].

## 2023-10-22 NOTE — Assessment & Plan Note (Signed)
 Patient with chronic daily headaches and also has a history of migraine headaches She has a nonfunctioning VP shunt and due to her symptoms of headaches, memory and balance issues, revision of the shunt was recommended by neurosurgery several years ago Will start patient on Fioricet  Consult neurosurgery for further recommendation

## 2023-10-22 NOTE — ED Notes (Signed)
 Pt in ct scan

## 2023-10-22 NOTE — Assessment & Plan Note (Signed)
 Continue venlafaxine

## 2023-10-22 NOTE — Assessment & Plan Note (Signed)
Treatment as outlined in 1

## 2023-10-22 NOTE — ED Triage Notes (Signed)
 Pt to ED via POV from home. Pt reports woke up this morning with dizziness and cervical pain. Pt with hx of VP shunt that she was told was broken. Pt currently does not have neurologist.

## 2023-10-22 NOTE — Assessment & Plan Note (Signed)
 Patient with a known history of degenerative disc disease involving the cervical spine who presents to the emergency room for evaluation of worsening neck pain and was found to have a fever with a Tmax of 100.2 and mild leukocytosis. Patient also noted to have concerns for meningeal signs and an LP was attempted in the ER without success. IR consult for LP in a.m. Place patient on droplet precautions Continue IV Rocephin  and vancomycin  for suspected meningitis Consult infectious disease for further evaluation

## 2023-10-22 NOTE — ED Notes (Signed)
  Pt signed Paper consent for lumbar puncture

## 2023-10-22 NOTE — ED Notes (Signed)
 Poct pregnancy Negative

## 2023-10-22 NOTE — Assessment & Plan Note (Signed)
 Patient has a history of congenital hydrocephalus and has a nonfunctioning VP shunt Due to complaints of headaches, memory difficulties and balance issues concerning for elevated intracranial pressure, revision of the shunt was recommended by neurosurgery as an outpatient. Will consult neurosurgery for further recommendation

## 2023-10-23 ENCOUNTER — Inpatient Hospital Stay

## 2023-10-23 ENCOUNTER — Telehealth: Payer: Self-pay

## 2023-10-23 DIAGNOSIS — M542 Cervicalgia: Secondary | ICD-10-CM | POA: Diagnosis not present

## 2023-10-23 DIAGNOSIS — Z8669 Personal history of other diseases of the nervous system and sense organs: Secondary | ICD-10-CM

## 2023-10-23 DIAGNOSIS — Q039 Congenital hydrocephalus, unspecified: Secondary | ICD-10-CM | POA: Diagnosis not present

## 2023-10-23 LAB — CBC WITH DIFFERENTIAL/PLATELET
Abs Immature Granulocytes: 0.05 K/uL (ref 0.00–0.07)
Basophils Absolute: 0.1 K/uL (ref 0.0–0.1)
Basophils Relative: 0 %
Eosinophils Absolute: 0 K/uL (ref 0.0–0.5)
Eosinophils Relative: 0 %
HCT: 45.2 % (ref 36.0–46.0)
Hemoglobin: 14.2 g/dL (ref 12.0–15.0)
Immature Granulocytes: 0 %
Lymphocytes Relative: 22 %
Lymphs Abs: 3 K/uL (ref 0.7–4.0)
MCH: 27.9 pg (ref 26.0–34.0)
MCHC: 31.4 g/dL (ref 30.0–36.0)
MCV: 88.8 fL (ref 80.0–100.0)
Monocytes Absolute: 0.7 K/uL (ref 0.1–1.0)
Monocytes Relative: 5 %
Neutro Abs: 9.5 K/uL — ABNORMAL HIGH (ref 1.7–7.7)
Neutrophils Relative %: 73 %
Platelets: 334 K/uL (ref 150–400)
RBC: 5.09 MIL/uL (ref 3.87–5.11)
RDW: 13.5 % (ref 11.5–15.5)
WBC: 13.4 K/uL — ABNORMAL HIGH (ref 4.0–10.5)
nRBC: 0 % (ref 0.0–0.2)

## 2023-10-23 LAB — COMPREHENSIVE METABOLIC PANEL WITH GFR
ALT: 40 U/L (ref 0–44)
AST: 34 U/L (ref 15–41)
Albumin: 3.3 g/dL — ABNORMAL LOW (ref 3.5–5.0)
Alkaline Phosphatase: 86 U/L (ref 38–126)
Anion gap: 15 (ref 5–15)
BUN: 11 mg/dL (ref 6–20)
CO2: 21 mmol/L — ABNORMAL LOW (ref 22–32)
Calcium: 8.9 mg/dL (ref 8.9–10.3)
Chloride: 101 mmol/L (ref 98–111)
Creatinine, Ser: 1.24 mg/dL — ABNORMAL HIGH (ref 0.44–1.00)
GFR, Estimated: 57 mL/min — ABNORMAL LOW (ref >60)
Glucose, Bld: 120 mg/dL — ABNORMAL HIGH (ref 70–99)
Potassium: 3.7 mmol/L (ref 3.5–5.1)
Sodium: 137 mmol/L (ref 135–145)
Total Bilirubin: 0.8 mg/dL (ref 0.0–1.2)
Total Protein: 7.2 g/dL (ref 6.5–8.1)

## 2023-10-23 LAB — CSF CELL COUNT WITH DIFFERENTIAL
Eosinophils, CSF: 0 %
Lymphs, CSF: 93 %
Monocyte-Macrophage-Spinal Fluid: 3 %
Other Cells, CSF: 0
RBC Count, CSF: 8 /mm3 — ABNORMAL HIGH (ref 0–3)
Segmented Neutrophils-CSF: 0 %
Tube #: 3
WBC, CSF: 3 /mm3 (ref 0–5)

## 2023-10-23 LAB — PROTIME-INR
INR: 1 (ref 0.8–1.2)
Prothrombin Time: 13.9 s (ref 11.4–15.2)

## 2023-10-23 LAB — MAGNESIUM: Magnesium: 2 mg/dL (ref 1.7–2.4)

## 2023-10-23 LAB — PROCALCITONIN: Procalcitonin: <0.1 ng/mL

## 2023-10-23 LAB — HIV ANTIBODY (ROUTINE TESTING W REFLEX): HIV Screen 4th Generation wRfx: NONREACTIVE

## 2023-10-23 LAB — LACTIC ACID, PLASMA
Lactic Acid, Venous: 4.9 mmol/L (ref 0.5–1.9)
Lactic Acid, Venous: 1.4 mmol/L (ref 0.5–1.9)

## 2023-10-23 LAB — PROTEIN AND GLUCOSE, CSF
Glucose, CSF: 64 mg/dL (ref 40–70)
Total  Protein, CSF: 79 mg/dL — ABNORMAL HIGH (ref 15–45)

## 2023-10-23 LAB — GLUCOSE, CAPILLARY: Glucose-Capillary: 139 mg/dL — ABNORMAL HIGH (ref 70–99)

## 2023-10-23 MED ORDER — LEVETIRACETAM 500 MG PO TABS
500.0000 mg | ORAL_TABLET | Freq: Two times a day (BID) | ORAL | Status: DC
  Administered 2023-10-23 – 2023-10-25 (×4): 500 mg via ORAL
  Filled 2023-10-23 (×5): qty 1

## 2023-10-23 MED ORDER — LACTATED RINGERS IV SOLN
150.0000 mL/h | INTRAVENOUS | Status: AC
  Administered 2023-10-23 (×3): 150 mL/h via INTRAVENOUS

## 2023-10-23 MED ORDER — DIAZEPAM 5 MG/ML IJ SOLN
2.5000 mg | INTRAMUSCULAR | Status: DC | PRN
  Administered 2023-10-23: 2.5 mg via INTRAVENOUS
  Filled 2023-10-23: qty 2

## 2023-10-23 MED ORDER — LEVETIRACETAM (KEPPRA) 500 MG/5 ML ADULT IV PUSH
2000.0000 mg | Freq: Once | INTRAVENOUS | Status: AC
  Administered 2023-10-23: 2000 mg via INTRAVENOUS
  Filled 2023-10-23: qty 20

## 2023-10-23 MED ORDER — LIDOCAINE HCL (PF) 1 % IJ SOLN
10.0000 mL | Freq: Once | INTRAMUSCULAR | Status: AC
Start: 2023-10-23 — End: 2023-10-23
  Administered 2023-10-23: 10 mL
  Filled 2023-10-23: qty 10

## 2023-10-23 NOTE — ED Provider Notes (Signed)
 Responded to a CODE BLUE that was called on patient.  She had a syncopal/seizure after going to the bathroom.  Fell on her buttocks, nursing just denies any head strike.  Episode was described as a staring spell lasted for several seconds.  No seizure-like activity.  On my arrival, she was sitting up and talking, she was pale.  Protecting her airway.  Fingerstick glucose and vitals were requested.  Patient was placed back into bed.  Dr. Cleatus who is the covering hospitalist arrived and I signed out care over to her.   Waymond Lorelle Cummins, MD 10/23/23 [phone]

## 2023-10-23 NOTE — Consult Note (Addendum)
 NAME: Denise Kline  DOB: [birth year]  MRN: [id number]  Date/Time: 10/23/2023 7:25 PM  REQUESTING PROVIDER: Dr.Agbata Subjective:  REASON FOR CONSULT: Meninigitis ? Denise Kline is a 38 y.o. female with a history of VP shunt when 38 weeks old never changed since then presents with 3 day h/o neck pain, headache and back pain, and I day h/o dizziness. 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 She came to ED in the afternoon as she was getting worse She says she always had headache almost nearly everyday on the forehead She did not have any fever, or cough or sore throat, or sob,  Some nausea Pt was told by a neurosurgeon a few years ago that the VP shunt was broken in 2 area in the chest and abdomen. She did not choose to repair /replace it as she was only given a 50 % chance of success In the ED vitals  10/22/23 14:10  BP 172/144 (H)  Temp 100.2 F (37.9 C)  Pulse Rate 114 !  Resp 18  SpO2 99 %    Latest Reference Range & Units 10/22/23 14:24  WBC 4.0 - 10.5 K/uL 12.4 (H)  Hemoglobin 12.0 - 15.0 g/dL 85.1  HCT 63.9 - 53.9 % 48.1 (H)  Platelets 150 - 400 K/uL 344  Creatinine 0.44 - 1.00 mg/dL 8.85 (H)   Bloood culture sent CT with contrast LP attempted not successful I am asked to see her to r/o meningitis related to VP shunt Early this morning when in the toliet she had a syncopal episode and was noted to have seizures? The nurses note states cramping LP was done today I Family members had resp illness recently but she was fine She takes only venlafaxine  Lives with her husband Non smoker, no alcohol or drugs No recent travel No animal or tick bites No tooth extraction recently LMP 1 year ago  Past Medical History:   Diagnosis Date   VP (ventriculoperitoneal) shunt status     No past surgical history on file.  Social History   Socioeconomic History   Marital status: Married    Spouse name: Not on file   Number of children: Not on file   Years of education: Not on file   Highest education level: Not on file  Occupational History   Not on file  Tobacco Use   Smoking status: Never   Smokeless tobacco: Not on file  Substance and Sexual Activity   Alcohol use: No   Drug use: No   Sexual activity: Not on file  Other Topics Concern   Not on file  Social History Narrative   Not on file   Social Drivers of Health   Financial Resource Strain: Not on file  Food Insecurity: No Food Insecurity (10/22/2023)   Hunger Vital Sign    Worried About Running Out of Food in the Last Year: Never true    Ran Out of Food in the Last Year: Never true  Transportation Needs: No Transportation Needs (10/22/2023)   PRAPARE - Administrator, Civil Service (Medical): No    Lack of Transportation (Non-Medical): No  Physical Activity: Not on file  Stress: Not on file  Social Connections:  Not on file  Intimate Partner Violence: Not At Risk (10/22/2023)   Humiliation, Afraid, Rape, and Kick questionnaire    Fear of Current or Ex-Partner: No    Emotionally Abused: No    Physically Abused: No    Sexually Abused: No    No family history on file. Allergies  Allergen Reactions   Aspirin     Seizures    I? Current Facility-Administered Medications  Medication Dose Route Frequency Provider Last Rate Last Admin   0.9 %  sodium chloride  infusion   Intravenous Continuous Agbata, Tochukwu, MD   Stopped at 10/23/23 0400   butalbital -acetaminophen -caffeine  (FIORICET ) 50-325-40 MG per tablet 1 tablet  1 tablet Oral Q4H PRN Agbata, Tochukwu, MD   1 tablet at 10/22/23 2116   cefTRIAXone  (ROCEPHIN ) 2 g in sodium chloride  0.9 % 100 mL IVPB  2 g Intravenous Q12H Agbata, Tochukwu, MD 200 mL/hr at 10/23/23 1828 2 g  at 10/23/23 1828   diazepam  (VALIUM ) injection 2.5 mg  2.5 mg Intravenous Q4H PRN Cleatus Delayne GAILS, MD   2.5 mg at 10/23/23 9786   hydrocortisone  cream 1 %   Topical Q4H PRN Agbata, Tochukwu, MD       lactated ringers  infusion  150 mL/hr Intravenous Continuous Cleatus Delayne GAILS, MD 150 mL/hr at 10/23/23 1825 150 mL/hr at 10/23/23 1825   levETIRAcetam  (KEPPRA ) tablet 500 mg  500 mg Oral BID Stack, Colleen M, MD       meclizine  (ANTIVERT ) tablet 25 mg  25 mg Oral TID Agbata, Tochukwu, MD   25 mg at 10/23/23 1600   ondansetron  (ZOFRAN ) tablet 4 mg  4 mg Oral Q6H PRN Agbata, Tochukwu, MD       Or   ondansetron  (ZOFRAN ) injection 4 mg  4 mg Intravenous Q6H PRN Agbata, Tochukwu, MD       oxyCODONE  (Oxy IR/ROXICODONE ) immediate release tablet 5 mg  5 mg Oral Q4H PRN Agbata, Tochukwu, MD   5 mg at 10/23/23 0306   venlafaxine  XR (EFFEXOR -XR) 24 hr capsule 150 mg  150 mg Oral Q breakfast Agbata, Tochukwu, MD   150 mg at 10/23/23 9161     Abtx:  Anti-infectives (From admission, onward)    Start     Dose/Rate Route Frequency Ordered Stop   10/23/23 1000  vancomycin  (VANCOREADY) IVPB 750 mg/150 mL  Status:  Discontinued        750 mg 150 mL/hr over 60 Minutes Intravenous Every 12 hours 10/22/23 1957 10/23/23 1730   10/23/23 0600  cefTRIAXone  (ROCEPHIN ) 2 g in sodium chloride  0.9 % 100 mL IVPB        2 g 200 mL/hr over 30 Minutes Intravenous Every 12 hours 10/22/23 1844     10/22/23 2245  vancomycin  (VANCOREADY) IVPB 750 mg/150 mL       Placed in Followed by Linked Group   750 mg 150 mL/hr over 60 Minutes Intravenous  Once 10/22/23 1957 10/23/23 0047   10/22/23 2045  vancomycin  (VANCOREADY) IVPB 1500 mg/300 mL       Placed in Followed by Linked Group   1,500 mg 150 mL/hr over 120 Minutes Intravenous  Once 10/22/23 1957 10/22/23 2309   10/22/23 1800  cefTRIAXone  (ROCEPHIN ) 1 g in sodium chloride  0.9 % 100 mL IVPB        1 g 200 mL/hr over 30 Minutes Intravenous  Once 10/22/23 1746 10/22/23 1825    10/22/23 1800  vancomycin  (VANCOCIN ) IVPB 1000 mg/200 mL premix  Status:  Discontinued  1,000 mg 200 mL/hr over 60 Minutes Intravenous  Once 10/22/23 1746 10/22/23 1920       REVIEW OF SYSTEMS:  Const: negative fever, negative chills, negative weight loss Eyes: negative diplopia or visual changes, negative eye pain ENT: negative coryza, negative sore throat Resp: negative cough, hemoptysis, dyspnea Cards: negative for chest pain, palpitations, lower extremity edema GU: negative for frequency, dysuria and hematuria GI: Negative for abdominal pain, diarrhea, bleeding, constipation Skin: negative for rash and pruritus Heme: negative for easy bruising and gum/nose bleeding MS: negative for myalgias, arthralgias, back pain and muscle weakness Neurolo:headaches, dizziness, neck pain, back pain, now syncope Was told she had petit mal in the past Psych: negative for feelings of anxiety, depression  Endocrine: negative for thyroid, diabetes Allergy/Immunology- aspirin ?  Objective:  VITALS:  BP 120/73 (BP Location: Right Wrist)   Pulse 98   Temp 98.3 F (36.8 C)   Resp 18   Ht 5' 4 (1.626 m)   Wt 112.4 kg   SpO2 98%   BMI 42.53 kg/m   PHYSICAL EXAM:  General: Alert, cooperative, no distress, appears stated age. Increased BMI Head: Normocephalic, without obvious abnormality, atraumatic. Eyes: Conjunctivae clear, anicteric sclerae. Pupils are equal ENT Nares normal. No drainage or sinus tenderness. Lips, mucosa, and tongue normal. No Thrush Neck:  symmetrical, no adenopathy, thyroid: non tender no carotid bruit and no JVD. Back: No CVA tenderness. Lungs: Clear to auscultation bilaterally. No Wheezing or Rhonchi. No rales. Heart: Regular rate and rhythm, no murmur, rub or gallop. Abdomen: Soft, non-tender,not distended. Bowel sounds normal. No masses Extremities: atraumatic, no cyanosis. No edema. No clubbing Skin: No rashes or lesions. Or bruising Lymph: Cervical,  supraclavicular normal. Neurologic: Grossly non-focal Pertinent Labs Lab Results CBC    Component Value Date/Time   WBC 13.4 (H) 10/23/2023 0200   RBC 5.09 10/23/2023 0200   HGB 14.2 10/23/2023 0200   HGB 14.6 11/16/2021 1534   HCT 45.2 10/23/2023 0200   HCT 44.7 11/16/2021 1534   PLT 334 10/23/2023 0200   PLT 362 11/16/2021 1534   MCV 88.8 10/23/2023 0200   MCV 89 11/16/2021 1534   MCH 27.9 10/23/2023 0200   MCHC 31.4 10/23/2023 0200   RDW 13.5 10/23/2023 0200   RDW 12.3 11/16/2021 1534   LYMPHSABS 3.0 10/23/2023 0200   MONOABS 0.7 10/23/2023 0200   EOSABS 0.0 10/23/2023 0200   BASOSABS 0.1 10/23/2023 0200       Latest Ref Rng & Units 10/23/2023    2:00 AM 10/22/2023    2:24 PM 04/18/2022   10:41 AM  CMP  Glucose 70 - 99 mg/dL 879  892  842   BUN 6 - 20 mg/dL 11  8  9    Creatinine 0.44 - 1.00 mg/dL 8.75  8.85  9.13   Sodium 135 - 145 mmol/L 137  137  139   Potassium 3.5 - 5.1 mmol/L 3.7  3.6  3.6   Chloride 98 - 111 mmol/L 101  104  103   CO2 22 - 32 mmol/L 21  23  24    Calcium 8.9 - 10.3 mg/dL 8.9  9.3  8.9   Total Protein 6.5 - 8.1 g/dL 7.2  8.7    Total Bilirubin 0.0 - 1.2 mg/dL 0.8  0.4    Alkaline Phos 38 - 126 U/L 86  108    AST 15 - 41 U/L 34  34    ALT 0 - 44 U/L 40  47  Microbiology: Recent Results (from the past 240 hours)  Resp panel by RT-PCR (RSV, Flu A&B, Covid) Anterior Nasal Swab     Status: None   Collection Time: 10/22/23  3:25 PM   Specimen: Anterior Nasal Swab  Result Value Ref Range Status   SARS Coronavirus 2 by RT PCR NEGATIVE NEGATIVE Final    Comment: (NOTE) SARS-CoV-2 target nucleic acids are NOT DETECTED.  The SARS-CoV-2 RNA is generally detectable in upper respiratory specimens during the acute phase of infection. The lowest concentration of SARS-CoV-2 viral copies this assay can detect is 138 copies/mL. A negative result does not preclude SARS-Cov-2 infection and should not be used as the sole basis for treatment  or other patient management decisions. A negative result may occur with  improper specimen collection/handling, submission of specimen other than nasopharyngeal swab, presence of viral mutation(s) within the areas targeted by this assay, and inadequate number of viral copies(<138 copies/mL). A negative result must be combined with clinical observations, patient history, and epidemiological information. The expected result is Negative.  Fact Sheet for Patients:  BloggerCourse.com  Fact Sheet for Healthcare Providers:  SeriousBroker.it  This test is no t yet approved or cleared by the [place]  FDA and  has been authorized for detection and/or diagnosis of SARS-CoV-2 by FDA under an Emergency Use Authorization (EUA). This EUA will remain  in effect (meaning this test can be used) for the duration of the COVID-19 declaration under Section 564(b)(1) of the Act, 21 U.S.C.section 360bbb-3(b)(1), unless the authorization is terminated  or revoked sooner.       Influenza A by PCR NEGATIVE NEGATIVE Final   Influenza B by PCR NEGATIVE NEGATIVE Final    Comment: (NOTE) The Xpert Xpress SARS-CoV-2/FLU/RSV plus assay is intended as an aid in the diagnosis of influenza from Nasopharyngeal swab specimens and should not be used as a sole basis for treatment. Nasal washings and aspirates are unacceptable for Xpert Xpress SARS-CoV-2/FLU/RSV testing.  Fact Sheet for Patients: BloggerCourse.com  Fact Sheet for Healthcare Providers: SeriousBroker.it  This test is not yet approved or cleared by the [place]  FDA and has been authorized for detection and/or diagnosis of SARS-CoV-2 by FDA under an Emergency Use Authorization (EUA). This EUA will remain in effect (meaning this test can be used) for the duration of the COVID-19 declaration under Section 564(b)(1) of the Act, 21 U.S.C. section  360bbb-3(b)(1), unless the authorization is terminated or revoked.     Resp Syncytial Virus by PCR NEGATIVE NEGATIVE Final    Comment: (NOTE) Fact Sheet for Patients: BloggerCourse.com  Fact Sheet for Healthcare Providers: SeriousBroker.it  This test is not yet approved or cleared by the [place]  FDA and has been authorized for detection and/or diagnosis of SARS-CoV-2 by FDA under an Emergency Use Authorization (EUA). This EUA will remain in effect (meaning this test can be used) for the duration of the COVID-19 declaration under Section 564(b)(1) of the Act, 21 U.S.C. section 360bbb-3(b)(1), unless the authorization is terminated or revoked.  Performed at [facility], [address] [place], [place] [address]   Blood culture (routine x 2)     Status: None (Preliminary result)   Collection Time: 10/22/23  4:06 PM   Specimen: BLOOD LEFT ARM  Result Value Ref Range Status   Specimen Description BLOOD LEFT ARM  Final   Special Requests BAA Blood Culture adequate volume  Final   Culture   Final    NO GROWTH < 12 HOURS Performed at [facility], [address] [place], [place] [address]    Report Status PENDING  Incomplete  Blood culture (routine x 2)     Status: None (Preliminary result)   Collection Time: 10/22/23  4:08 PM   Specimen: BLOOD  Result Value Ref Range Status   Specimen Description BLOOD RIGHT ANTECUBITAL  Final   Special Requests   Final    BOTTLES DRAWN AEROBIC AND ANAEROBIC Blood Culture adequate volume   Culture   Final    NO GROWTH < 12 HOURS Performed at [facility], [address] [place], [place] [address]    Report Status PENDING  Incomplete  CSF culture w Gram Stain     Status: None (Preliminary result)   Collection Time: 10/23/23  2:37 PM   Specimen: PATH Cytology CSF; Cerebrospinal Fluid  Result Value Ref Range Status   Specimen Description CSF  Final    Special Requests NONE  Final   Gram Stain   Final    NO ORGANISMS SEEN WBC SEEN RED BLOOD CELLS PRESENT Performed at [facility], [address], [place], [place] [address]    Culture PENDING  Incomplete   Report Status PENDING  Incomplete    Lines and Device Date on insertion # of days DC  Central line     Foley     ETT       IMAGING RESULTS:  I have personally reviewed the films ?VP shunt in place- rt vlateral ventricle smaller than left  Impression/Recommendation ?pt with VP shunt since the age of 5 weeks which is fractured in 2 places and has never been repalced At baseline she has head ache over the forehead almost every day Sudden onset neck pain, back pain  and severe dizziness When she came to ED her BP was 172/144  Hypertensive emergency could be cause of the symptoms though  the BP was normal after the first reading Could she have cerebellar CVA? Need  MRI with contrast Could she have PRES? Could she have vestibular migraine? 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 Cr is increased could be from too many NSAID No nephrotoxic medication  This consult involved complex antimicrobial management  __I have personally spent  -60--minutes involved in face-to-face and non-face-to-face activities for this patient on the day of the visit. Professional time spent includes the following activities: Preparing to see the patient (review of tests), Obtaining and/or reviewing separately obtained history  (admission/discharge record), Performing a medically appropriate examination and/or evaluation , Ordering medications/tests/procedures, referring and communicating with other health care professionals, Documenting clinical information in the EMR, Independently interpreting results (not separately reported), Communicating results to the patient/family/caregiver, Counseling and educating the patient/family/caregiver and Care coordination (not separately reported).    ________________________________________________ Discussed with patient, husband requesting provider

## 2023-10-23 NOTE — Progress Notes (Signed)
 Escorted patient to the bathroom, while patient was washing her hands she started getting dizzy  She stated I am not feeling well. I told patient to sit on bed side commode. Patient sat in commode and went unresponsive, positive for a pulse but abnormal breathing. Patient was unable to keep her head up. I called out for some one to initiate a code blue. I requested a blanket to slide patient onto floor. Staff attempted to move patient from bathroom to bed. Code Blue team arrived along with Dr.Duncan. Patient awaken while in transition, but was unaware of events. Patient was pale and diaphoretic. Once patient was placed in bed, patient was placed in trendelenburg.  CBG was normal, blood pressure was low. A normal saline bolus was administered. Patient begin complaining of cramps in right hand and both legs. Valium  was given once blood pressure improved. Patient is alert and oriented but does not remember anything after entering the bathroom. Labs were taken, electrolytes were stable, lactic acid 4.9. Septic protocol was initiated. Chaplain is at the bedside providing emotional support. Patient has informed her significant other. Vitals are improving, patient is complaining of neck pain.

## 2023-10-23 NOTE — Progress Notes (Signed)
 CROSS COVER NOTE Significant event: Overhead CODE BLUE  NAME: Denise Kline MRN: [id number] DOB : [birth year]    Concern as stated by nurse / staff   Per bedside nurse: Patient was helped to bedside commode , appeared to stiffen and stare and then slumped over.  Did not fall.  She was placed on the floor and CODE BLUE activated.  Patient started coming around immediately thereafter.  Initially unable to record BP, pulse in the 1 teens, sinus rhythm     Pertinent findings on chart review: Patient with history of blocked VP shunt admitted earlier with neck pain and possible meningitis pending LP  Patient Assessment Upon my arrival, CODE BLUE team at bedside.  Patient being held sitting up on the floor outside the bathroom door. Patient lethargic with eyes closed but moving. Signout taken from ED doc Patient being lifted back into the bed 10/23/23 00:23:11 99.2 F (37.3 C) 108 Abnormal  18 115/77 Lying 99 % Room Air  10/22/23 22:06:26 99.1 F (37.3 C) 115 Abnormal  19 121/72 Lying 98 % Room Air  10/22/23 2003 99.1 F (37.3 C) 120 Abnormal  19 137/86 Lying 100 % Room Air  10/22/23 20:02:55 99.1 F (37.3 C) 120 Abnormal  19 137/86 Lying 100 % Room Air  Physical Exam Vitals and nursing note reviewed.  Constitutional:      General: She is not in acute distress.    Comments: Able to answer questions.  Responds slowly   HENT:     Head: Normocephalic and atraumatic.  Cardiovascular:     Rate and Rhythm: Normal rate and regular rhythm.     Heart sounds: Normal heart sounds.  Pulmonary:     Effort: Pulmonary effort is normal.     Breath sounds: Normal breath sounds.  Abdominal:     Palpations: Abdomen is soft.     Tenderness: There is no abdominal tenderness.  Neurological:     General: No focal deficit present.     Mental Status: She is lethargic.         Workup Lactic Acid, Venous    Component Value Date/Time   LATICACIDVEN 4.9 (HH) 10/23/2023 0200       Latest Ref Rng & Units 10/23/2023    2:00 AM 10/22/2023    2:24 PM 04/18/2022   10:41 AM  CBC  WBC 4.0 - 10.5 K/uL 13.4  12.4  6.4   Hemoglobin 12.0 - 15.0 g/dL 85.7  85.1  86.0   Hematocrit 36.0 - 46.0 % 45.2  48.1  44.0   Platelets 150 - 400 K/uL 334  344  300       Latest Ref Rng & Units 10/23/2023    2:00 AM 10/22/2023    2:24 PM 04/18/2022   10:41 AM  CMP  Glucose 70 - 99 mg/dL 879  892  842   BUN 6 - 20 mg/dL 11  8  9    Creatinine 0.44 - 1.00 mg/dL 8.75  8.85  9.13   Sodium 135 - 145 mmol/L 137  137  139   Potassium 3.5 - 5.1 mmol/L 3.7  3.6  3.6   Chloride 98 - 111 mmol/L 101  104  103   CO2 22 - 32 mmol/L 21  23  24    Calcium 8.9 - 10.3 mg/dL 8.9  9.3  8.9   Total Protein 6.5 - 8.1 g/dL 7.2  8.7    Total Bilirubin 0.0 - 1.2 mg/dL 0.8  0.4    Alkaline Phos 38 - 126 U/L 86  108    AST 15 - 41 U/L 34  34    ALT 0 - 44 U/L 40  47       Assessment and  Interventions   Assessment:  Syncope, ?seizure SIRS with lactic acidosis, possible sepsis Hypotension  Plan: Continue antibiotics Sepsis fluids Valium  IV as needed seizure Continuous cardiac monitoring Continue other management      CRITICAL CARE Performed by: Delayne LULLA Solian   Total critical care time: 90 minutes  Critical care time was exclusive of separately billable procedures and treating other patients.  Critical care was necessary to treat or prevent imminent or life-threatening deterioration.  Critical care was time spent personally by me on the following activities: development of treatment plan with patient and/or surrogate as well as nursing, discussions with consultants, evaluation of patient's response to treatment, examination of patient, obtaining history from patient or surrogate, ordering and performing treatments and interventions, ordering and review of laboratory studies, ordering and review of radiographic studies, pulse oximetry and re-evaluation of patient's condition.

## 2023-10-23 NOTE — Progress Notes (Signed)
 Progress Note   Patient: Denise Kline FMW:[id number] DOB: 1985/03/12 DOA: 10/22/2023     1 DOS: the patient was seen and examined on 10/23/2023   Brief hospital course:  Denise Kline is a 38 y.o. 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  Pain is associated with dizziness but she denies having any tinnitus.  Dizziness is worse with any form of movement of her head.  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  She complains of daily headaches involving the frontal as well as temporal areas.  Due to the persistence of her symptoms she presented to the ER for further evaluation.  She notes that she has had subjective fever and chills at home. Upon arrival to the ER she was noted to have a Tmax of 100.2 and she was tachycardic Abnormal labs include a white count of 12.4 CT scan of the head without contrast shows no acute findings. Stable right parietal ventriculostomy catheter with tip over the midline of the right lateral ventricle. Lateral ventricles are asymmetric with the left larger than right unchanged from the prior exam. Chart review shows that patient was seen by neurosurgery in 2019 and had a nuclear medicine study performed at the time which revealed no flow through her catheter tube.  Patient subsequently developed increased headaches, migraine, balance and memory problems and an MRI was done which showed decompressed ventricles but that the patient had fluid tracking along the shunt pathway concerning for elevated pressure.  Recommendation was made to revise the patient's shunt but she appears to have been lost to  follow-up. ER physician attempted a lumbar puncture in the ER but was unsuccessful Due to patient's low-grade fever, leukocytosis and severe neck pain there was a concern for possible meningitis and so patient was started on Rocephin  and vancomycin  and will be admitted to the hospital for further evaluation.    Assessment and Plan:   Neck pain Patient with a known history of degenerative disc disease involving the cervical spine who presented to the emergency room for evaluation of worsening neck pain and was found to have a fever with a Tmax of 100.2 and mild leukocytosis. Patient also noted to have concerns for meningeal signs and an LP was attempted in the ER without success For LP by IR today Continue droplet precautions Continue IV Rocephin  and vancomycin  for suspected meningitis Consult infectious disease for further evaluation   Meningitis Treatment as outlined in 1   Depression Continue venlafaxine    Congenital hydrocephalus (HCC) Patient has a history of congenital hydrocephalus and has a nonfunctioning VP shunt Due to complaints of headaches, memory difficulties and balance issues concerning for elevated intracranial pressure, revision of the shunt was recommended by neurosurgery as an outpatient.  Patient did not follow through with the recommendations Appreciate neurosurgery input, recommends to drain about 30 cc of spinal fluid if patient is noted to have elevated opening pressure. If patient's symptoms improve with drainage then will consider revising shunt.   History of migraine headaches Patient with chronic daily headaches and also has a history of migraine headaches She has a nonfunctioning VP shunt and due to her  symptoms of headaches, memory and balance issues, revision of the shunt was recommended by neurosurgery several years ago which patient did not follow through with. Headache has improved with Fioricet     Obesity (BMI 30-39.9) Lifestyle modification and  exercise has been discussed with patient in detail She verbalizes understanding and agrees to the plan     ??  Seizure disorder Patient states that she was told by her neurologist several years ago that she probably has absences -she describes it as staring off into space She had an episode last night 09/23 Consult neurology Obtain EEG   09/24 -headache and neck pain have improved.  Scheduled for LP this morning.  Events of this morning noted. Code blue was called and patient was found sitting on the floor and staring into space.     Subjective: No new complaints.  Physical Exam: Vitals:   10/23/23 0302 10/23/23 0402 10/23/23 0600 10/23/23 0758  BP: 121/74 107/70 (!) 103/59 110/67  Pulse: (!) 102 (!) 117 (!) 106 100  Resp: 19 17  16   Temp:  99.8 F (37.7 C)  99.5 F (37.5 C)  TempSrc:  Oral  Oral  SpO2: 100% 100%  98%  Weight:      Height:       Vitals and nursing note reviewed.  Constitutional:      Appearance: She is obese.     Comments: Appears uncomfortable  HENT:     Head: Normocephalic and atraumatic.     Nose: Nose normal.     Mouth/Throat:     Mouth: Mucous membranes are moist.  Eyes:     Conjunctiva/sclera: Conjunctivae normal.  Neck:     Comments: Cervical tenderness Cardiovascular:     Rate and Rhythm: Tachycardia present.  Pulmonary:     Effort: Pulmonary effort is normal.     Breath sounds: Normal breath sounds.  Abdominal:     General: Bowel sounds are normal.     Palpations: Abdomen is soft.     Comments: Central adiposity  Musculoskeletal:        General: Normal range of motion.  Skin:    General: Skin is warm and dry.  Neurological:     General: No focal deficit present.     Mental Status: She is oriented to person, place, and time.  Psychiatric:        Mood and Affect: Mood normal.        Behavior: Behavior normal.       Data Reviewed: Sodium 137, potassium 3.7, chloride 101, bicarb 21, glucose 120, BUN 11, creatinine 1.24, white  count 13.4, procalcitonin less than 0.10, lactic acid 4.9 >> 1.4 Labs reviewed  Family Communication: Plan of care discussed with patient at the bedside.  She verbalizes understanding and agrees with the plan  Disposition: Status is: Inpatient Remains inpatient appropriate because: For lumbar puncture today  Planned Discharge Destination: Home    Time spent: 50 minutes  Author: Aimee Somerset, MD 10/23/2023 10:54 AM  For on call review [email or web address].

## 2023-10-23 NOTE — Plan of Care (Signed)
  Problem: Clinical Measurements: Goal: Ability to maintain clinical measurements within normal limits will improve Outcome: Not Progressing Goal: Will remain free from infection Outcome: Not Progressing Goal: Diagnostic test results will improve Outcome: Not Progressing Goal: Respiratory complications will improve Outcome: Progressing   Problem: Activity: Goal: Risk for activity intolerance will decrease Outcome: Not Progressing

## 2023-10-23 NOTE — Significant Event (Addendum)
 Rapid Response Event Note   Reason for Call :  Code blue  Initial Focused Assessment:  Patient on Floor with staff supporting PT. Patient Pale and lethargic. Patients sugar stable at 138 unable to get Pts BP while on the floor. HR 109 O2 100%. Patient moved to bed by staff with blanket. Per primary nurse Pt was washing her hands at the sink when she started to get dizzy and patient went unresponsive then was assisted to the floor by staff.  Once Pt was in the bed Pt was awake but unaware of the events that had happen. Pt began cramping in hands and legs. Dr cleatus at bed side BP 80/65. 1 Liter bolus of saline started wide open and labs ordered. BP 106/77 Hr 109 O2 100% on Room Air Pt began cramping again Valium  administrated. With relief Pt aneixty decreased and cramping stopped.   0425 Checked in with primary nurse Pt is being worked up by provider for sepsis. PT is Sad but currently stable.    MD Notified: Dr cleatus Call Upfz:[id number] Arrival Upfz:[id number] End Time:0220  Lesley LOISE Shams, RN

## 2023-10-23 NOTE — Progress Notes (Signed)
 Eeg done

## 2023-10-23 NOTE — Progress Notes (Addendum)
 Pharmacy Antibiotic Note  Denise Kline is a 38 y.o. female admitted on 10/22/2023 with concerns for meningitis. PMH significant for congenital hydrocephalus status post VP shunt which is currently nonfunctional, history of migraine headaches, obesity. Pharmacy has been consulted for Vancomycin  dosing.  9/23: LP planned for the AM, Scr 1.14 (BL < 1). Will continue to monitor 9/24: Scr 1.14>1.24.  Plan: - Continue vancomycin  750mg  IV q12H eAUC 550, Cmin 17.7 - Dose per levels since meningitis and follow up LP results once available - Will check a vancomycin  trough level at steady state before 3rd or 4th maintenance dose - Also on Ceftriaxone  2 g q12h    Height: 5' 4 (162.6 cm) Weight: 112.4 kg (247 lb 12.8 oz) IBW/kg (Calculated) : 54.7  Temp (24hrs), Avg:99.2 F (37.3 C), Min:98.5 F (36.9 C), Max:100.2 F (37.9 C)  Recent Labs  Lab 10/22/23 1424 10/22/23 1606 10/22/23 2040 10/23/23 0200 10/23/23 0503  WBC 12.4*  --   --  13.4*  --   CREATININE 1.14*  --   --  1.24*  --   LATICACIDVEN  --  1.2 1.9 4.9* 1.4    Estimated Creatinine Clearance: 75.6 mL/min (A) (by C-G formula based on SCr of 1.24 mg/dL (H)).    Allergies  Allergen Reactions   Aspirin     Seizures     Antimicrobials this admission: CRO 9/23 >>  Vancomycin  9/23 >>   Dose adjustments this admission: n/a  Microbiology results: 9/23 BCx: ordered 9/24 LP: pending  Thank you for allowing pharmacy to be a part of this patient's care.  Will M. Lenon, PharmD, BCPS Clinical Pharmacist 10/23/2023 10:41 AM

## 2023-10-23 NOTE — Procedures (Signed)
 PROCEDURE SUMMARY:  Successful fluoroscopic guided lumbar puncture at the level of L4-5.  Opening pressure was 24 cm H2O ~12 mL clear colorless fluid collected and sent for labs.  No immediate complications.  Pt tolerated well.   EBL = <57mL  Please see full dictation in imaging section of Epic for procedure details.   Electronically Signed: Ahmaad Neidhardt M Keymani Glynn, PA-C 10/23/2023, 2:28 PM

## 2023-10-23 NOTE — Telephone Encounter (Signed)
 Dr. Clois has sent the following chat message:  hey - when there's some time today, can you or one of the others who knows how to pull imaging try to get some imaging from [facility] for this patient? I am specifically interested in CT head and MRI brain from the last 10 years. There's a few in care everywhere.

## 2023-10-23 NOTE — Telephone Encounter (Signed)
 Images have been requested

## 2023-10-23 NOTE — Consult Note (Signed)
 Consult requested by:  Dr. Lanetta  Consult requested for:  Neck pain, history of shunt  Primary Physician:  [place]  History of Present Illness: 10/23/2023 Ms. Denise Kline is here today with a chief complaint of neck pain as well as daily headaches.  She has been having headaches for years.  She has previously had her shunt evaluated several years ago and was offered shunt exploration with replacement of her shunt, but ultimately elected not to pursue this.  She presented to the emergency department with tachycardia and low-grade temperature.  She felt chills at home.  She additionally had an elevated white blood cell count.  Due to concern for possible meningitis, lumbar puncture was attempted but unsuccessful.  She is planned for lumbar puncture today.   The symptoms are causing a significant impact on the patient's life.   I have utilized the care everywhere function in epic to review the outside records available from external health systems.  Review of Systems:  A 10 point review of systems is negative, except for the pertinent positives and negatives detailed in the HPI.  Past Medical History: Past Medical History:  Diagnosis Date   VP (ventriculoperitoneal) shunt status     Past Surgical History: No past surgical history on file.  Allergies: Allergies as of 10/22/2023 - Reviewed 10/22/2023  Allergen Reaction Noted   Aspirin  05/15/2013    Medications: No outpatient medications have been marked as taking for the 10/22/23 encounter [facility] Encounter).    Social History: Social History   Tobacco Use   Smoking status: Never  Substance Use Topics   Alcohol use: No   Drug use: No    Family Medical History: No family history on file.  Physical Examination: Vitals:   10/23/23 0600 10/23/23 0758  BP: (!) 103/59 110/67  Pulse: (!) 106 100  Resp:  16  Temp:  99.5 F (37.5 C)  SpO2:  98%    General: Patient is in  no apparent distress. Attention to examination is appropriate.  Neck:   Supple but with discomfort on flexion.  Full range of motion.  Respiratory: Patient is breathing without any difficulty.   NEUROLOGICAL:     Awake, alert, oriented to person, place, and time.  Speech is clear and fluent.  Cranial Nerves: Pupils equal round and reactive to light.  Facial tone is symmetric.  Facial sensation is symmetric. Shoulder shrug is symmetric. Tongue protrusion is midline.  There is no pronator drift.  Strength: MAEW 5/5   Bilateral upper and lower extremity sensation is intact to light touch.    No evidence of dysmetria noted.  Gait is untested.     Medical Decision Making  Imaging: CT Head 10/22/2023 IMPRESSION: 1. No acute findings. 2. Stable right parietal ventriculostomy catheter with tip over the midline of the right lateral ventricle. Lateral ventricles are asymmetric with the left larger than right unchanged from the prior exam.     Electronically Signed   By: Toribio Agreste M.D.   On: 10/22/2023 16:16  Xrays of shunt reviewed - 2 fractures. One discontinuity in the neck and one just above abdomen.  I have personally reviewed the images and agree with the above interpretation.  Assessment and Plan: Denise Kline is a pleasant 38 y.o. female with chronic daily headaches.  She has had fractures in her catheter for more than a decade.  It is very unlikely that her shunt has been working.  She is being evaluated  for meningitis.  If CSF cultures reveal positive findings, we will discuss whether removal of the shunt is appropriate.  It is very unlikely that her shunt is currently functional.  As such, she will probably not need placement of a ventricular drain at the same time.  That being said, we will discuss placement of a ventricular drain at the time of removal of her shunt if that is ultimately necessary.  I have communicated my recommendations to the requesting physician  and coordinated care to facilitate these recommendations.     Bernadett Milian K. Clois MD, [facility] Neurosurgery

## 2023-10-23 NOTE — Progress Notes (Signed)
   10/23/23 0141  Spiritual Encounters  Type of Visit Initial  Care provided to: Patient;Family (Husband came back to the hospital)  Conversation partners present during encounter Other (comment);Nurse;Physician (Code [employer])  Referral source Code page  Reason for visit Code  OnCall Visit Yes  Spiritual Framework  Presenting Themes Meaning/purpose/sources of inspiration;Goals in life/care;Values and beliefs;Coping tools;Impactful experiences and emotions  Patient Stress Factors Health changes;Other (Comment) (Missing her 35 yo daughter and younger son)  Family Stress Factors Exhausted  Interventions  Spiritual Care Interventions Made Established relationship of care and support;Compassionate presence;Reflective listening;Normalization of emotions;Explored values/beliefs/practices/strengths;Other (comment);Prayer (Prayed for Pt, helped her call her husband (he had gone home to rest) and she likes Zada Keith's music so we played some of his videos to help her calm down while they were getting her meds set up)  Intervention Outcomes  Outcomes Connection to spiritual care;Awareness around self/spiritual resourses;Connection to values and goals of care;Awareness of health;Awareness of support;Reduced anxiety;Reduced fear

## 2023-10-24 ENCOUNTER — Inpatient Hospital Stay
Admit: 2023-10-24 | Discharge: 2023-10-24 | Disposition: A | Discharge status: Home / Self Care | Payer: Self-pay | Attending: Neurosurgery | Admitting: Neurosurgery

## 2023-10-24 ENCOUNTER — Other Ambulatory Visit: Payer: Self-pay | Admitting: Family Medicine

## 2023-10-24 DIAGNOSIS — Z049 Encounter for examination and observation for unspecified reason: Secondary | ICD-10-CM

## 2023-10-24 DIAGNOSIS — Q039 Congenital hydrocephalus, unspecified: Secondary | ICD-10-CM

## 2023-10-24 DIAGNOSIS — R42 Dizziness and giddiness: Secondary | ICD-10-CM | POA: Diagnosis not present

## 2023-10-24 DIAGNOSIS — R519 Headache, unspecified: Secondary | ICD-10-CM

## 2023-10-24 DIAGNOSIS — F325 Major depressive disorder, single episode, in full remission: Secondary | ICD-10-CM

## 2023-10-24 DIAGNOSIS — R569 Unspecified convulsions: Secondary | ICD-10-CM

## 2023-10-24 DIAGNOSIS — M542 Cervicalgia: Secondary | ICD-10-CM | POA: Diagnosis not present

## 2023-10-24 LAB — MENINGITIS/ENCEPHALITIS PANEL (CSF)

## 2023-10-24 LAB — CREATININE, SERUM
Creatinine, Ser: 1.03 mg/dL — ABNORMAL HIGH (ref 0.44–1.00)
GFR, Estimated: >60 mL/min (ref >60)

## 2023-10-24 LAB — HSV 1/2 PCR, CSF
HSV-1 DNA: NEGATIVE
HSV-2 DNA: NEGATIVE

## 2023-10-24 NOTE — Progress Notes (Signed)
  Progress Note   Date: 10/24/2023  Patient Name: Denise Kline        MRN#: [id number]   Clarification of diagnosis:   Obesity class III  Morbid Obesity

## 2023-10-24 NOTE — Progress Notes (Signed)
 Update from information yesterday.  - Findings inconsistent with meningitis or shunt infection - Patient has had daily headaches for extended period, with known shunt fractures for more than a decade.   - Will review head CT with radiology now that we have prior imaging available.  On my initial review it appears that her ventricular system may be slightly enlarged compared to prior (5 years ago)

## 2023-10-24 NOTE — Consult Note (Signed)
 Reason for Consult:evaluate for papilledema Referring Physician: Dr. Lanetta  Chief complaint: headaches, neck pain  HPI: Denise Kline is an 38 y.o. 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  She reports a past ocular history significant for amblyopia, but no previous eye surgeries or treatments besides glasses for hyperopia.  She sought care in the [facility] ED for neck pain.  She was admitted to the hospital and given her history an ophthalmology consult was requested.   At the time, she reported some dizziness which was exacerbated with eye movement, which has resolved. She denies visual changes.  No blurry vision, visual snow, flashes, floaters, curtain like vision loss, double vision.  Past Medical History:  Diagnosis Date   VP (ventriculoperitoneal) shunt status     ROS  No past surgical history on file.  No family history on file.  Social History:  reports that she has never smoked. She does not have any smokeless tobacco history on file. She reports that she does not drink alcohol and does not use drugs.  Allergies:  Allergies  Allergen Reactions   Aspirin     Seizures     Prior to Admission medications   Medication Sig Start Date End Date Taking? Authorizing Provider  acetaminophen  (TYLENOL ) 500 MG tablet Take 1,000 mg by mouth every 6 (six) hours as needed for mild pain, moderate pain or headache. Patient not taking: Reported on 11/16/2021    [provider]  venlafaxine  XR (EFFEXOR -XR) 150 MG 24 hr capsule Take 150 mg by mouth daily with breakfast.    [provider]    Results for orders placed or performed during the hospital encounter of 10/22/23 (from the past 48 hours)  Lactic acid, plasma     Status: None   Collection Time: 10/22/23  8:40 PM  Result Value Ref Range   Lactic Acid, Venous 1.9 0.5 - 1.9 mmol/L    Comment: Performed at [facility], [address] [place], [place] [address]   HIV Antibody (routine testing w rflx)     Status: None   Collection Time: 10/22/23  8:40 PM  Result Value Ref Range   HIV Screen 4th Generation wRfx Non Reactive Non Reactive    Comment: Performed at [facility], [address], [place], [place] [address]  Glucose, capillary     Status: Abnormal   Collection Time: 10/23/23  1:47 AM  Result Value Ref Range   Glucose-Capillary 139 (H) 70 - 99 mg/dL    Comment: Glucose reference range applies only to samples taken after fasting for at least 8 hours.  CBC with Differential     Status: Abnormal   Collection Time: 10/23/23  2:00 AM  Result Value Ref Range   WBC 13.4 (H) 4.0 - 10.5 K/uL   RBC 5.09 3.87 - 5.11 MIL/uL   Hemoglobin 14.2 12.0 - 15.0 g/dL   HCT 54.7 63.9 - 53.9 %   MCV 88.8 80.0 - 100.0 fL   MCH 27.9 26.0 - 34.0 pg   MCHC 31.4 30.0 - 36.0 g/dL   RDW 86.4 88.4 - 84.4 %   Platelets 334 150 - 400 K/uL   nRBC 0.0 0.0 - 0.2 %   Neutrophils Relative % 73 %   Neutro Abs 9.5 (H) 1.7 - 7.7 K/uL   Lymphocytes Relative 22 %   Lymphs Abs 3.0 0.7 - 4.0 K/uL   Monocytes Relative 5 %   Monocytes Absolute 0.7 0.1 -  1.0 K/uL   Eosinophils Relative 0 %   Eosinophils Absolute 0.0 0.0 - 0.5 K/uL   Basophils Relative 0 %   Basophils Absolute 0.1 0.0 - 0.1 K/uL   Immature Granulocytes 0 %   Abs Immature Granulocytes 0.05 0.00 - 0.07 K/uL    Comment: Performed at [facility], [address] [place], [place] [address]  Comprehensive metabolic panel     Status: Abnormal   Collection Time: 10/23/23  2:00 AM  Result Value Ref Range   Sodium 137 135 - 145 mmol/L   Potassium 3.7 3.5 - 5.1 mmol/L   Chloride 101 98 - 111 mmol/L   CO2 21 (L) 22 - 32 mmol/L   Glucose, Bld 120 (H) 70 - 99 mg/dL    Comment: Glucose reference range applies only to samples taken after fasting for at least 8 hours.   BUN 11 6 - 20 mg/dL   Creatinine, Ser 8.75 (H) 0.44 - 1.00 mg/dL   Calcium 8.9 8.9 - 89.6 mg/dL   Total Protein 7.2 6.5 - 8.1 g/dL    Albumin 3.3 (L) 3.5 - 5.0 g/dL   AST 34 15 - 41 U/L   ALT 40 0 - 44 U/L   Alkaline Phosphatase 86 38 - 126 U/L   Total Bilirubin 0.8 0.0 - 1.2 mg/dL   GFR, Estimated 57 (L) >60 mL/min    Comment: (NOTE) Calculated using the CKD-EPI Creatinine Equation (2021)    Anion gap 15 5 - 15    Comment: Performed at [facility], [address] [place], [place] [address]  Lactic acid, plasma     Status: Abnormal   Collection Time: 10/23/23  2:00 AM  Result Value Ref Range   Lactic Acid, Venous 4.9 (HH) 0.5 - 1.9 mmol/L    Comment: CRITICAL RESULT CALLED TO, READ BACK BY AND VERIFIED WITH  MARCEL TURNER AT 0230 10/23/23 JG Performed at [facility], [address] [place], [place] [address]   Protime-INR     Status: None   Collection Time: 10/23/23  2:00 AM  Result Value Ref Range   Prothrombin Time 13.9 11.4 - 15.2 seconds   INR 1.0 0.8 - 1.2    Comment: (NOTE) INR goal varies based on device and disease states. Performed at [facility], [address] [place], [place] [address]   Magnesium     Status: None   Collection Time: 10/23/23  2:00 AM  Result Value Ref Range   Magnesium 2.0 1.7 - 2.4 mg/dL    Comment: Performed at [facility], [address] [place], [place] [address]  Procalcitonin     Status: None   Collection Time: 10/23/23  2:00 AM  Result Value Ref Range   Procalcitonin <0.10 ng/mL    Comment:        Interpretation: PCT (Procalcitonin) <= 0.5 ng/mL: Systemic infection (sepsis) is not likely. Local bacterial infection is possible. (NOTE)       Sepsis PCT Algorithm           Lower Respiratory Tract                                      Infection PCT Algorithm    ----------------------------     ----------------------------         PCT < 0.25 ng/mL  PCT < 0.10 ng/mL          Strongly encourage             Strongly discourage   discontinuation of antibiotics    initiation of antibiotics     ----------------------------     -----------------------------       PCT 0.25 - 0.50 ng/mL            PCT 0.10 - 0.25 ng/mL               OR       >80% decrease in PCT            Discourage initiation of                                            antibiotics      Encourage discontinuation           of antibiotics    ----------------------------     -----------------------------         PCT >= 0.50 ng/mL              PCT 0.26 - 0.50 ng/mL               AND        <80% decrease in PCT             Encourage initiation of                                             antibiotics       Encourage continuation           of antibiotics    ----------------------------     -----------------------------        PCT >= 0.50 ng/mL                  PCT > 0.50 ng/mL               AND         increase in PCT                  Strongly encourage                                      initiation of antibiotics    Strongly encourage escalation           of antibiotics                                     -----------------------------                                           PCT <= 0.25 ng/mL                                                 OR                                        >  80% decrease in PCT                                      Discontinue / Do not initiate                                             antibiotics  Performed at [facility], [address] [place], [place] [address]   Lactic acid, plasma     Status: None   Collection Time: 10/23/23  5:03 AM  Result Value Ref Range   Lactic Acid, Venous 1.4 0.5 - 1.9 mmol/L    Comment: Performed at [facility], [address] [place], [place] [address]  CSF cell count with differential     Status: Abnormal   Collection Time: 10/23/23  2:37 PM  Result Value Ref Range   Tube # 3    Color, CSF COLORLESS COLORLESS   Appearance, CSF CLEAR CLEAR   Supernatant CLEAR    RBC Count, CSF 8 (H) 0 - 3 /cu mm   WBC, CSF 3 0 - 5  /cu mm   Segmented Neutrophils-CSF 0 %   Lymphs, CSF 93 %   Monocyte-Macrophage-Spinal Fluid 3 %   Eosinophils, CSF 0 %   Other Cells, CSF 0     Comment: Performed at [facility], [address] [place], [place] [address]  CSF culture w Gram Stain     Status: None (Preliminary result)   Collection Time: 10/23/23  2:37 PM   Specimen: PATH Cytology CSF; Cerebrospinal Fluid  Result Value Ref Range   Specimen Description      CSF Performed at [facility], [address] [place], [place] [address]    Special Requests      NONE Performed at [facility], [address], [place], [place] [address]    Gram Stain      NO ORGANISMS SEEN WBC SEEN RED BLOOD CELLS PRESENT Performed at [facility], [address], [place], [place] [address]    Culture      NO GROWTH < 24 HOURS Performed at [facility], [address] [place], [place] [address]    Report Status PENDING   Protein and glucose, CSF     Status: Abnormal   Collection Time: 10/23/23  2:37 PM  Result Value Ref Range   Glucose, CSF 64 40 - 70 mg/dL   Total  Protein, CSF 79 (H) 15 - 45 mg/dL    Comment: Performed at [facility], [address] [place], [place] [address]  HSV 1/2 PCR, CSF (reference lab) Cerebrospinal Fluid     Status: None   Collection Time: 10/23/23  2:37 PM  Result Value Ref Range   HSV-1 DNA Negative Negative   HSV-2 DNA Negative Negative    Comment: (NOTE) Performed At: [facility] [address] [place], [place] [id number] Jennette Shorter MD Ey:[id number]   Meningitis/Encephalitis Panel (CSF)     Status: None   Collection Time: 10/23/23  2:37 PM  Result Value Ref Range   Cryptococcus neoformans/gattii (CSF) NOT DETECTED NOT DETECTED    Comment: (NOTE) Patients with a suspicion of cryptococcal meningitis should be tested  for cryptococcal antigen (CrAg).      Cytomegalovirus (CSF) NOT DETECTED  NOT DETECTED   Enterovirus (CSF)  NOT DETECTED NOT DETECTED   Escherichia coli K1 (CSF) NOT DETECTED NOT DETECTED    Comment: (NOTE) Only E. coli strains possessing the K1 capsular antigen will be detected.      Haemophilus influenzae (CSF) NOT DETECTED NOT DETECTED   Herpes simplex virus 1 (CSF) NOT DETECTED NOT DETECTED   Herpes simplex virus 2 (CSF) NOT DETECTED NOT DETECTED   Human herpesvirus 6 (CSF) NOT DETECTED NOT DETECTED   Human parechovirus (CSF) NOT DETECTED NOT DETECTED   Listeria monocytogenes (CSF) NOT DETECTED NOT DETECTED   Neisseria meningitis (CSF) NOT DETECTED NOT DETECTED    Comment: (NOTE) Only encapsulated strains of N. meningitidis will be detected.     Streptococcus agalactiae (CSF) NOT DETECTED NOT DETECTED   Streptococcus pneumoniae (CSF) NOT DETECTED NOT DETECTED   Varicella zoster virus (CSF) NOT DETECTED NOT DETECTED    Comment: Performed at [facility], [address], [place], [place] [address]  Creatinine, serum     Status: Abnormal   Collection Time: 10/24/23  2:43 AM  Result Value Ref Range   Creatinine, Ser 1.03 (H) 0.44 - 1.00 mg/dL   GFR, Estimated >39 >39 mL/min    Comment: (NOTE) Calculated using the CKD-EPI Creatinine Equation (2021) Performed at [facility], [address] [place], [place] [address]     DG FL GUIDED LUMBAR PUNCTURE Result Date: 10/23/2023 CLINICAL DATA:  38 year old female with a history of congenital hydrocephalus s/p VP shunt which is been nonfunctional for several years. Patient presented to the ED with complaints of 2 days of neck pain, headaches, and dizziness. Request for diagnostic lumbar puncture. EXAM: LUMBAR PUNCTURE UNDER FLUOROSCOPY PROCEDURE: An appropriate skin entry site was determined fluoroscopically. Operator donned sterile gloves and mask. Skin site was marked, then prepped with Betadine, draped in usual sterile fashion, and infiltrated locally with 1% lidocaine . A 20 gauge spinal needle advanced into the thecal sac at  L4-5 from a left interlaminar approach. Clear colorless CSF spontaneously returned, with opening pressure of 24 cm water. 12 ml CSF were collected and divided among 4 sterile vials for the requested laboratory studies. The needle was then removed. The patient tolerated the procedure well and there were no complications. FLUOROSCOPY: Radiation Exposure Index (as provided by the fluoroscopic device): 25.2 mGy Kerma IMPRESSION: Technically successful lumbar puncture under fluoroscopy. This exam was performed by [facility] PA-C, and was supervised and interpreted by Dr. ONEIDA Specking. Electronically Signed   By: CHRISTELLA.  Shick M.D.   On: 10/23/2023 15:43    Blood pressure 127/75, pulse 82, temperature 98.5 F (36.9 C), resp. rate 19, height 5' 4 (1.626 m), weight 112.4 kg, SpO2 97%.  Mental status: Alert and Oriented x 4  Visual Acuity:  20/40 OD  20/30 near [place]  Pupils:  Equally round/ reactive to light.  No Afferent defect.  Motility:  Full/ orthophoric  Visual Fields:  Full to confrontation  IOP:  soft to palpation  External/ Lids/ Lashes:  Normal  Anterior Segment:  Conjunctiva:  Normal  OU  Cornea:  Normal  OU  Anterior Chamber: Normal  OU  Lens:   Normal OU  Posterior Segment: Dilated OU with 1% Tropicamide and 2.5% Phenylephrine  Discs:   Normal c/d ratio 0.35 OU, no pallor, no edema OU  Macula:  Normal  Vessels/ Periphery: Normal    Assessment/Plan: Patient with history of hydrocephalus and nonfunctional VP shunt examined today with dilated eye exam.  No  signs of papilledema on exam.  Normal, reassuring eye exam.  This does not rule out increased intracranial pressure. Recommend f/u outpatient for baseline visual field testing and optic disc photos. Signing off.  Denise Kline 10/24/2023, 6:16 PM

## 2023-10-24 NOTE — Plan of Care (Signed)

## 2023-10-24 NOTE — Plan of Care (Signed)

## 2023-10-24 NOTE — Progress Notes (Signed)
 Date of Admission:  10/22/2023      ID: Denise Kline is a 38 y.o. female Principal Problem:   Neck pain Active Problems:   Meningitis   Obesity (BMI 30-39.9)   History of migraine headaches   Congenital hydrocephalus (HCC)   Depression   Dizziness    Subjective: Pt feeling better Headache improved  Medications:   levETIRAcetam   500 mg Oral BID   venlafaxine  XR  150 mg Oral Q breakfast    Objective: Vital signs in last 24 hours: Patient Vitals for the past 24 hrs:  BP Temp Temp src Pulse Resp SpO2  10/24/23 1533 127/75 98.5 F (36.9 C) -- 82 19 97 %  10/24/23 0800 128/70 98.3 F (36.8 C) -- 95 19 95 %  10/24/23 0550 (!) 137/96 97.9 F (36.6 C) -- 71 20 98 %  10/24/23 0501 111/69 98.2 F (36.8 C) -- 90 16 97 %  10/23/23 2126 114/77 99.4 F (37.4 C) Oral 95 17 98 %     Lines and Device Date on insertion # of days DC  Engineer, technical sales     ETT       PHYSICAL EXAM:  General: Alert, cooperative, no distress, appears stated age.  Head: Normocephalic, without obvious abnormality, atraumatic. Eyes: Conjunctivae clear, anicteric sclerae. Pupils are equal ENT Nares normal. No drainage or sinus tenderness. Lips, mucosa, and tongue normal. No Thrush Neck: Supple, symmetrical, no adenopathy, thyroid: non tender no carotid bruit and no JVD. Back: No CVA tenderness. Lungs: Clear to auscultation bilaterally. No Wheezing or Rhonchi. No rales. Heart: Regular rate and rhythm, no murmur, rub or gallop. Abdomen: Soft, non-tender,not distended. Bowel sounds normal. No masses Extremities: atraumatic, no cyanosis. No edema. No clubbing Skin: No rashes or lesions. Or bruising Lymph: Cervical, supraclavicular normal. Neurologic: Grossly non-focal  Lab Results    Latest Ref Rng & Units 10/23/2023    2:00 AM 10/22/2023    2:24 PM 04/18/2022   10:41 AM  CBC  WBC 4.0 - 10.5 K/uL 13.4  12.4  6.4   Hemoglobin 12.0 - 15.0 g/dL 85.7  85.1  86.0    Hematocrit 36.0 - 46.0 % 45.2  48.1  44.0   Platelets 150 - 400 K/uL 334  344  300        Latest Ref Rng & Units 10/24/2023    2:43 AM 10/23/2023    2:00 AM 10/22/2023    2:24 PM  CMP  Glucose 70 - 99 mg/dL  879  892   BUN 6 - 20 mg/dL  11  8   Creatinine 9.55 - 1.00 mg/dL 8.96  8.75  8.85   Sodium 135 - 145 mmol/L  137  137   Potassium 3.5 - 5.1 mmol/L  3.7  3.6   Chloride 98 - 111 mmol/L  101  104   CO2 22 - 32 mmol/L  21  23   Calcium 8.9 - 10.3 mg/dL  8.9  9.3   Total Protein 6.5 - 8.1 g/dL  7.2  8.7   Total Bilirubin 0.0 - 1.2 mg/dL  0.8  0.4   Alkaline Phos 38 - 126 U/L  86  108   AST 15 - 41 U/L  34  34   ALT 0 - 44 U/L  40  47       Microbiology: Csf ME panel Neg Csf Culture neg so far Studies/Results: DG FL GUIDED LUMBAR PUNCTURE Result Date: 10/23/2023 CLINICAL  DATA:  38 year old female with a history of congenital hydrocephalus s/p VP shunt which is been nonfunctional for several years. Patient presented to the ED with complaints of 2 days of neck pain, headaches, and dizziness. Request for diagnostic lumbar puncture. EXAM: LUMBAR PUNCTURE UNDER FLUOROSCOPY PROCEDURE: An appropriate skin entry site was determined fluoroscopically. Operator donned sterile gloves and mask. Skin site was marked, then prepped with Betadine, draped in usual sterile fashion, and infiltrated locally with 1% lidocaine . A 20 gauge spinal needle advanced into the thecal sac at L4-5 from a left interlaminar approach. Clear colorless CSF spontaneously returned, with opening pressure of 24 cm water. 12 ml CSF were collected and divided among 4 sterile vials for the requested laboratory studies. The needle was then removed. The patient tolerated the procedure well and there were no complications. FLUOROSCOPY: Radiation Exposure Index (as provided by the fluoroscopic device): 25.2 mGy Kerma IMPRESSION: Technically successful lumbar puncture under fluoroscopy. This exam was performed by [facility] PA-C,  and was supervised and interpreted by Dr. ONEIDA Specking. Electronically Signed   By: CHRISTELLA.  Shick M.D.   On: 10/23/2023 15:43     Assessment/Plan: Patient with VP shunt since the age of 5 weeks for congenital hydrocephalus.  It is fractured in 2 places now because it was never replaced since the age of 5 weeks. At baseline she has had headache over the forehead almost every day.  She came in with sudden onset neck pain, back pain and severe dizziness. When she came to the ED her BP was 172/144  Intracerebral bleed ruled out by  CT head ? hypertensive emergency which caused her symptoms as the BP on presentation was 172/144 but she has had normal readings after that. ? Brain stem/cerebellar CVA?  Would need an MRI with contrast Could she havePRES? Could you have vestibular migraine? She had an episode of syncope and noted to have seizure-like movements so we need to rule out seizures History of PCOS so we will need to rule out pseudotumor cerebri Could it be a malfunctioning VP shunt? Neurology has been consulted.  I was asked to see the patient to rule out meningitis or shunt infection The lumbar puncture is showing only 3 WBCs with negative ME panel The protein is slightly elevated at 76. There is no evidence of meningitis or VP shunt infection So her antibiotics were all discontinued last night   I discussed the management with the patient and the care team. I will sign off.  Call if needed

## 2023-10-24 NOTE — Progress Notes (Signed)
 Progress Note   Patient: Denise Kline DOB: Apr 27, 1985 DOA: 10/22/2023     2 DOS: the patient was seen and examined on 10/24/2023   Brief hospital course:  Denise Kline is a 38 y.o. 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  Pain is associated with dizziness but she denies having any tinnitus.  Dizziness is worse with any form of movement of her head.  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  She complains of daily headaches involving the frontal as well as temporal areas.  Due to the persistence of her symptoms she presented to the ER for further evaluation.  She notes that she has had subjective fever and chills at home. Upon arrival to the ER she was noted to have a Tmax of 100.2 and she was tachycardic Abnormal labs include a white count of 12.4 CT scan of the head without contrast shows no acute findings. Stable right parietal ventriculostomy catheter with tip over the midline of the right lateral ventricle. Lateral ventricles are asymmetric with the left larger than right unchanged from the prior exam. Chart review shows that patient was seen by neurosurgery in 2019 and had a nuclear medicine study performed at the time which revealed no flow through her catheter tube.  Patient subsequently developed increased headaches, migraine, balance and memory problems and an MRI was done which showed decompressed ventricles but that the patient had fluid tracking along the shunt pathway concerning for elevated pressure.  Recommendation was made to revise the patient's shunt but she appears to have been lost to  follow-up. ER physician attempted a lumbar puncture in the ER but was unsuccessful Due to patient's low-grade fever, leukocytosis and severe neck pain there was a concern for possible meningitis and so patient was started on Rocephin  and vancomycin  and will be admitted to the hospital for further evaluation.      Assessment and Plan:   Neck pain Patient with a known history of degenerative disc disease involving the cervical spine who presented to the emergency room for evaluation of worsening neck pain and was found to have a fever with a Tmax of 100.2 and mild leukocytosis. Patient also noted to have concerns for meningeal signs and an LP was attempted in the ER without success Spinal fluid is sterile.  CSF culture and Gram stain showed no growth in 24 hours.  Meningitis and encephalitis panel is negative Appreciate ID input.  No evidence of meningitis or shunt infection Discontinue droplet precautions Discontinue antibiotics    Meningitis Treatment as outlined in 1   Depression Continue venlafaxine    Congenital hydrocephalus (HCC) Patient has a history of congenital hydrocephalus and has a nonfunctioning VP shunt Due to complaints of headaches, memory difficulties and balance issues concerning for elevated intracranial pressure, revision of the shunt was recommended by neurosurgery as an outpatient.  Patient did not follow through with the recommendations  Appreciate neurosurgery input, patient will need to follow-up as an outpatient     History of migraine headaches Patient with chronic daily headaches and also has a history of migraine headaches She has a nonfunctioning VP shunt and due  to her symptoms of headaches, memory and balance issues, revision of the shunt was recommended by neurosurgery several years ago which patient did not follow through with. Headache has improved with Fioricet  Neurology consult     Obesity (BMI 30-39.9) Lifestyle modification and exercise has  been discussed with patient in detail She verbalizes understanding and agrees to the plan     ??  Seizure disorder Patient states that she was told by her neurologist several years ago that she probably has absences -she describes it as staring off into space She had an episode last night 09/23 Consult neurology Obtain EEG     09/24 -headache and neck pain have improved.  Scheduled for LP this morning.  Events of this morning noted. Code blue was called and patient was found sitting on the floor and staring into space.   09/25 -improved headache.  Persistent neck pain       Subjective: Headache has improved.  Neck pain persists but patient  Physical Exam: Vitals:   10/23/23 2126 10/24/23 0501 10/24/23 0550 10/24/23 0800  BP: 114/77 111/69 (!) 137/96 128/70  Pulse: 95 90 71 95  Resp: 17 16 20 19   Temp: 99.4 F (37.4 C) 98.2 F (36.8 C) 97.9 F (36.6 C) 98.3 F (36.8 C)  TempSrc: Oral     SpO2: 98% 97% 98% 95%  Weight:      Height:       Constitutional:      Appearance: She is obese.     Comments: Appears uncomfortable  HENT:     Head: Normocephalic and atraumatic.     Nose: Nose normal.     Mouth/Throat:     Mouth: Mucous membranes are moist.  Eyes:     Conjunctiva/sclera: Conjunctivae normal.  Neck:     Comments: Cervical tenderness Cardiovascular:     Rate and Rhythm: RRR S1, S2.  Pulmonary:     Effort: Pulmonary effort is normal.     Breath sounds: Normal breath sounds.  Abdominal:     General: Bowel sounds are normal.     Palpations: Abdomen is soft.     Comments: Central adiposity  Musculoskeletal:        General: Normal range of motion.  Skin:    General: Skin is warm and dry.  Neurological:     General: No focal deficit present.     Mental Status: She is oriented to person, place, and time.  Psychiatric:        Mood and Affect: Mood normal.        Behavior: Behavior normal.  Data Reviewed: Creatinine 1.03 Labs reviewed  Family  Communication: Plan of care discussed with patient in detail.  She verbalizes understanding and agrees with the plan  Disposition: Status is: Inpatient Remains inpatient appropriate because: Pain control  Planned Discharge Destination: Home    Time spent: 40 minutes  Author: Aimee Somerset, MD 10/24/2023 11:21 AM  For on call review [email or web address].

## 2023-10-25 DIAGNOSIS — M542 Cervicalgia: Secondary | ICD-10-CM | POA: Diagnosis not present

## 2023-10-25 LAB — BASIC METABOLIC PANEL WITH GFR
Anion gap: 9 (ref 5–15)
BUN: 13 mg/dL (ref 6–20)
CO2: 25 mmol/L (ref 22–32)
Calcium: 8.8 mg/dL — ABNORMAL LOW (ref 8.9–10.3)
Chloride: 105 mmol/L (ref 98–111)
Creatinine, Ser: 0.93 mg/dL (ref 0.44–1.00)
GFR, Estimated: >60 mL/min (ref >60)
Glucose, Bld: 100 mg/dL — ABNORMAL HIGH (ref 70–99)
Potassium: 3.5 mmol/L (ref 3.5–5.1)
Sodium: 139 mmol/L (ref 135–145)

## 2023-10-25 MED ORDER — TOPIRAMATE 50 MG PO TABS: ORAL_TABLET | ORAL | 0 refills | Status: DC

## 2023-10-25 MED ORDER — LEVETIRACETAM 500 MG PO TABS: 500.0000 mg | ORAL_TABLET | Freq: Two times a day (BID) | ORAL | 0 refills | Status: AC

## 2023-10-25 MED ORDER — TOPIRAMATE 50 MG PO TABS: ORAL_TABLET | ORAL | 0 refills | Status: AC

## 2023-10-25 MED ORDER — LEVETIRACETAM 500 MG PO TABS: 500.0000 mg | ORAL_TABLET | Freq: Two times a day (BID) | ORAL | 0 refills | Status: DC

## 2023-10-25 MED ORDER — TOPIRAMATE 25 MG PO TABS: 50.0000 mg | ORAL_TABLET | Freq: Every day | ORAL | Status: DC

## 2023-10-25 MED ORDER — TOPIRAMATE 25 MG PO TABS: 50.0000 mg | ORAL_TABLET | Freq: Two times a day (BID) | ORAL | Status: DC

## 2023-10-25 NOTE — Progress Notes (Signed)
 Patient to discharge home. Medications called to preferred pharmacy. Instructions for home care and follow up provided to patient. Education provided regarding medication changes. PIV removed. Questions answered. No concerns voiced.

## 2023-10-25 NOTE — Discharge Summary (Signed)
 Physician Discharge Summary   Patient: Denise Kline MRN: [id number] DOB: 05-Jul-1985  Admit date:     10/22/2023  Discharge date: 10/25/23  Discharge Physician: Tilia Faso   PCP: [facility]   Recommendations at discharge:   Take medications as recommended Follow-up with neurology and neurosurgery as an outpatient  Discharge Diagnoses: Principal Problem:   Neck pain Active Problems:   Meningitis   Obesity (BMI 30-39.9)   History of migraine headaches   Congenital hydrocephalus (HCC)   Depression   Dizziness  Resolved Problems:   * No resolved hospital problems. *  Hospital Course:  Denise Kline is a 38 y.o. 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  Pain is associated with dizziness but she denies having any tinnitus.  Dizziness is worse with any form of movement of her head.  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  She complains of daily headaches involving the frontal as well as temporal areas.  Due to the persistence of her symptoms she presented to the ER for further evaluation.  She notes that she has had subjective fever and chills at home. Upon arrival to the ER she was noted to have a Tmax of 100.2 and she was tachycardic Abnormal labs include a white count of 12.4 CT scan of the head without contrast shows no acute findings. Stable right parietal ventriculostomy catheter with tip over the midline of the right lateral ventricle. Lateral ventricles are asymmetric with the left larger than right unchanged from the prior exam. Chart review shows that patient was seen  by neurosurgery in 2019 and had a nuclear medicine study performed at the time which revealed no flow through her catheter tube.  Patient subsequently developed increased headaches, migraine, balance and memory problems and an MRI was done which showed decompressed ventricles but that the patient had fluid tracking along the shunt pathway concerning for elevated pressure.  Recommendation was made to revise the patient's shunt but she appears to have been lost to follow-up. ER physician attempted a lumbar puncture in the ER but was unsuccessful Due to patient's low-grade fever, leukocytosis and severe neck pain there was a concern for possible meningitis and so patient was started on Rocephin  and vancomycin  and will be admitted to the hospital for further evaluation.        Assessment and Plan:  Neck pain Cervical spine degenerative disease Patient with a known history of degenerative disc disease involving the cervical spine who presented to the emergency room for evaluation of worsening neck pain and was found to have a fever with a Tmax of 100.2 and mild leukocytosis. Patient has been afebrile and leukocytosis resolved Patient also noted to have concerns for meningeal signs and an LP was attempted in the ER without success. CSF was drawn by interventional radiology Spinal fluid is sterile.  CSF culture and Gram stain showed no growth in 24 hours.  Meningitis and encephalitis panel is negative.  HSV is negative as well Appreciate ID input.  No evidence of meningitis or shunt infection Discontinue droplet precautions Discontinue antibiotics     Meningitis (ruled out)  Depression Continue venlafaxine    Congenital hydrocephalus (HCC) Patient has a history of congenital hydrocephalus and has a nonfunctioning VP shunt Due to complaints of headaches, memory difficulties and balance issues concerning for elevated intracranial pressure, revision of the shunt was recommended by neurosurgery  as an outpatient.  Patient did not follow through with the recommendations  Appreciate neurosurgery input, patient will need to follow-up as an outpatient       History of migraine headaches Patient with chronic daily headaches and also has a history of migraine headaches She has a nonfunctioning VP shunt and due to her symptoms of headaches, memory and balance issues, revision of the shunt was recommended by neurosurgery several years ago which patient did not follow through with. Headache has improved with Fioricet  Appreciate neurology input input Recommended ophthalmology evaluation to evaluate for possible papilledema due to concerns of increased intracranial pressure. Patient was seen and evaluated by ophthalmology and had a dilated eye exam with no signs of papilledema.  Patient to follow-up with ophthalmology for baseline visual field testing and optic disc photos     Morbid obesity (BMI 42.53.  Pain) Complicates overall prognosis and care Lifestyle modification and exercise has been discussed with patient in detail She verbalizes understanding and agrees to the plan      Seizure disorder Patient states that she was told by her neurologist several years ago that she probably has absences -she describes it as staring off into space She had an episode last night 09/23 Appreciate neurology input.  Recommends to discharge patient on Keppra  and Topamax  Patient had an EEG, results are still pending Follow-up with neurology as an outpatient       Consultants: Neurology, neurosurgery, infectious disease, ophthalmology, interventional radiology Procedures performed: Lumbar puncture Disposition: Home Diet recommendation:  Discharge Diet Orders (From admission, onward)     Start     Ordered   10/25/23 0000  Diet general        10/25/23 1046           Regular diet DISCHARGE MEDICATION: Allergies as of 10/25/2023       Reactions   Aspirin    Seizures         Medication List     STOP taking these medications    acetaminophen  500 MG tablet Commonly known as: TYLENOL        TAKE these medications    levETIRAcetam  500 MG tablet Commonly known as: KEPPRA  Take 1 tablet (500 mg total) by mouth 2 (two) times daily.   topiramate  50 MG tablet Commonly known as: TOPAMAX  Take 1 tablet (50 mg total) by mouth at bedtime for 6 days, THEN 1 tablet (50 mg total) 2 (two) times daily. Start taking on: October 25, 2023   venlafaxine  XR 150 MG 24 hr capsule Commonly known as: EFFEXOR -XR Take 150 mg by mouth daily with breakfast.        Follow-up Information     Maree Jannett POUR, MD Follow up in 2 week(s).   Specialty: Neurology Contact information: [phone] [facility] [address] [facility] [place] [address] [phone]         Clois Fret, MD Follow up in 2 week(s).   Specialty: Neurosurgery Contact information: [address] [place] [address] [phone]         [facility] Follow up in 1 week(s).   Specialty: Family Medicine Contact information: [address] [place] [address] [phone]  Discharge Exam: Filed Weights   10/22/23 1928  Weight: 112.4 kg   Appearance: She is obese.     Comments: Appears uncomfortable  HENT:     Head: Normocephalic and atraumatic.     Nose: Nose normal.     Mouth/Throat:     Mouth: Mucous membranes are moist.  Eyes:     Conjunctiva/sclera: Conjunctivae normal.  Neck:     Comments: Cervical tenderness Cardiovascular:     Rate and Rhythm: RRR S1, S2.  Pulmonary:     Effort: Pulmonary effort is normal.     Breath sounds: Normal breath sounds.  Abdominal:     General: Bowel sounds are normal.     Palpations: Abdomen is soft.     Comments: Central adiposity  Musculoskeletal:        General: Normal range of motion.  Skin:    General: Skin is warm and dry.   Neurological:     General: No focal deficit present.     Mental Status: She is oriented to person, place, and time.  Psychiatric:        Mood and Affect: Mood normal.        Behavior: Behavior normal.       Condition at discharge: stable  The results of significant diagnostics from this hospitalization (including imaging, microbiology, ancillary and laboratory) are listed below for reference.   Imaging Studies: CT HEAD WO CONTRAST Addendum Date: 10/24/2023 ADDENDUM REPORT: 10/24/2023 08:43 ADDENDUM: Previous CT from 01/31/2018 has become available for direct correlation. Patient is left posterior parietal ventriculostomy catheter and ventricular system is unchanged from the prior CT. Electronically Signed   By: Toribio Agreste M.D.   On: 10/24/2023 08:43   Result Date: 10/24/2023 CLINICAL DATA:  Woke up this morning with dizziness and neck pain. History of VP shunt that she was told was broken. Headache. EXAM: CT HEAD WITHOUT CONTRAST TECHNIQUE: Contiguous axial images were obtained from the base of the skull through the vertex without intravenous contrast. RADIATION DOSE REDUCTION: This exam was performed according to the departmental dose-optimization program which includes automated exposure control, adjustment of the mA and/or kV according to patient size and/or use of iterative reconstruction technique. COMPARISON:  CT 02/23/2003, report of MRI brain 09/22/2020 FINDINGS: Brain: Evidence of patient's known right parietal ventriculostomy catheter with tip over the midline at the mid aspect of the right lateral ventricle which is unchanged. Lateral ventricles are asymmetric with the left larger than right unchanged from the prior exam. There is no mass, mass effect, shift of midline structures or acute hemorrhage. No evidence to suggest acute infarction. Ventriculostomy catheter is intact. Vascular: No hyperdense vessel or unexpected calcification. Skull: Normal. Negative for fracture or focal  lesion. Sinuses/Orbits: Visualized orbits and paranasal sinuses are unremarkable. Other: None. IMPRESSION: 1. No acute findings. 2. Stable right parietal ventriculostomy catheter with tip over the midline of the right lateral ventricle. Lateral ventricles are asymmetric with the left larger than right unchanged from the prior exam. Electronically Signed: By: Toribio Agreste M.D. On: 10/22/2023 16:16   DG FL GUIDED LUMBAR PUNCTURE Result Date: 10/23/2023 CLINICAL DATA:  38 year old female with a history of congenital hydrocephalus s/p VP shunt which is been nonfunctional for several years. Patient presented to the ED with complaints of 2 days of neck pain, headaches, and dizziness. Request for diagnostic lumbar puncture. EXAM: LUMBAR PUNCTURE UNDER FLUOROSCOPY PROCEDURE: An appropriate skin entry site was determined fluoroscopically. Operator donned sterile gloves and mask. Skin site was marked, then  prepped with Betadine, draped in usual sterile fashion, and infiltrated locally with 1% lidocaine . A 20 gauge spinal needle advanced into the thecal sac at L4-5 from a left interlaminar approach. Clear colorless CSF spontaneously returned, with opening pressure of 24 cm water. 12 ml CSF were collected and divided among 4 sterile vials for the requested laboratory studies. The needle was then removed. The patient tolerated the procedure well and there were no complications. FLUOROSCOPY: Radiation Exposure Index (as provided by the fluoroscopic device): 25.2 mGy Kerma IMPRESSION: Technically successful lumbar puncture under fluoroscopy. This exam was performed by [facility] PA-C, and was supervised and interpreted by Dr. ONEIDA Specking. Electronically Signed   By: CHRISTELLA.  Shick M.D.   On: 10/23/2023 15:43   DG Abd 1 View Result Date: 10/22/2023 EXAM: 1 VIEW XRAY OF THE ABDOMEN 10/22/2023 03:25:14 PM COMPARISON: None available. CLINICAL HISTORY: evaluate for shunt malfunction. Pt reports woke up this morning with dizziness  and cervical pain. Pt with hx of VP shunt that she was told was broken. Pt currently does not have neurologist. FINDINGS: BOWEL: Nonobstructive bowel gas pattern. SOFT TISSUES: Abandoned segment of VP shunt tubing identified overlying the right sacrum. No abnormal abdominal calcifications. No opaque urinary calculi. BONES: No acute osseous abnormality. IMPRESSION: 1. No acute abdominal findings. 2. Abandoned VP shunt tubing fragment overlies the right sacrum. Electronically signed by: Waddell Calk MD 10/22/2023 03:56 PM EDT RP Workstation: [id number]   DG Cervical Spine 1 View Result Date: 10/22/2023 CLINICAL DATA:  Dizziness and cervical pain for evaluation of shunt malfunction EXAM: DG CERVICAL SPINE - 1 VIEW; SKULL - 2 VIEW COMPARISON:  None Available. FINDINGS: Lines/tubes: Right parieto-occipital approach ventriculoperitoneal shunt catheter tip terminates over the expected location of the medial right lateral ventricle. Expected radiolucent portion over the right occipital region. The shunt catheter courses inferiorly over the right suboccipital region with a long segment discontinuity spanning 7.3 cm from the level of C2-C6. There are no findings of fracture. There is no evidence of arthropathy or other focal bone abnormality of the partially imaged cervical spine. Soft tissues are unremarkable. IMPRESSION: 1. Right parieto-occipital approach ventriculoperitoneal shunt catheter tip terminates over the expected location of the medial right lateral ventricle. 2. Long segment discontinuity of the shunt catheter spanning 7.3 cm from the level of C2-C6. Electronically Signed   By: Limin  Xu M.D.   On: 10/22/2023 15:54   DG Skull 1-3 Views Result Date: 10/22/2023 CLINICAL DATA:  Dizziness and cervical pain for evaluation of shunt malfunction EXAM: DG CERVICAL SPINE - 1 VIEW; SKULL - 2 VIEW COMPARISON:  None Available. FINDINGS: Lines/tubes: Right parieto-occipital approach ventriculoperitoneal shunt catheter  tip terminates over the expected location of the medial right lateral ventricle. Expected radiolucent portion over the right occipital region. The shunt catheter courses inferiorly over the right suboccipital region with a long segment discontinuity spanning 7.3 cm from the level of C2-C6. There are no findings of fracture. There is no evidence of arthropathy or other focal bone abnormality of the partially imaged cervical spine. Soft tissues are unremarkable. IMPRESSION: 1. Right parieto-occipital approach ventriculoperitoneal shunt catheter tip terminates over the expected location of the medial right lateral ventricle. 2. Long segment discontinuity of the shunt catheter spanning 7.3 cm from the level of C2-C6. Electronically Signed   By: Limin  Xu M.D.   On: 10/22/2023 15:54   DG Chest 1 View Result Date: 10/22/2023 EXAM: 1 VIEW(S) XRAY OF THE CHEST 10/22/2023 03:25:14 PM COMPARISON: 04/17/2020. CLINICAL HISTORY: evaluate for  shunt malfunction. Pt reports woke up this morning with dizziness and cervical pain. Pt with hx of VP shunt that she was told was broken. Pt currently does not have neurologist. FINDINGS: LINES, TUBES AND DEVICES: Abandoned portions of old right ventricular peritoneal shunt tubing noted overlying the medial aspect of the right upper hemithorax. Stable in position from previous exam. LUNGS AND PLEURA: No focal pulmonary opacity. No pulmonary edema. No pleural effusion. No pneumothorax. HEART AND MEDIASTINUM: No acute abnormality of the cardiac and mediastinal silhouettes. BONES AND SOFT TISSUES: No acute osseous abnormality. IMPRESSION: 1. Normal chest radiograph. No acute cardiopulmonary abnormality detected. 2. Presumed abandoned sections of an old right ventricular peritoneal shunt tubing noted overlying the medial aspect of the right upper hemithorax. Electronically signed by: Waddell Calk MD 10/22/2023 03:52 PM EDT RP Workstation: [id number]    Microbiology: Results for orders  placed or performed during the hospital encounter of 10/22/23  Resp panel by RT-PCR (RSV, Flu A&B, Covid) Anterior Nasal Swab     Status: None   Collection Time: 10/22/23  3:25 PM   Specimen: Anterior Nasal Swab  Result Value Ref Range Status   SARS Coronavirus 2 by RT PCR NEGATIVE NEGATIVE Final    Comment: (NOTE) SARS-CoV-2 target nucleic acids are NOT DETECTED.  The SARS-CoV-2 RNA is generally detectable in upper respiratory specimens during the acute phase of infection. The lowest concentration of SARS-CoV-2 viral copies this assay can detect is 138 copies/mL. A negative result does not preclude SARS-Cov-2 infection and should not be used as the sole basis for treatment or other patient management decisions. A negative result may occur with  improper specimen collection/handling, submission of specimen other than nasopharyngeal swab, presence of viral mutation(s) within the areas targeted by this assay, and inadequate number of viral copies(<138 copies/mL). A negative result must be combined with clinical observations, patient history, and epidemiological information. The expected result is Negative.  Fact Sheet for Patients:  BloggerCourse.com  Fact Sheet for Healthcare Providers:  SeriousBroker.it  This test is no t yet approved or cleared by the [place]  FDA and  has been authorized for detection and/or diagnosis of SARS-CoV-2 by FDA under an Emergency Use Authorization (EUA). This EUA will remain  in effect (meaning this test can be used) for the duration of the COVID-19 declaration under Section 564(b)(1) of the Act, 21 U.S.C.section 360bbb-3(b)(1), unless the authorization is terminated  or revoked sooner.       Influenza A by PCR NEGATIVE NEGATIVE Final   Influenza B by PCR NEGATIVE NEGATIVE Final    Comment: (NOTE) The Xpert Xpress SARS-CoV-2/FLU/RSV plus assay is intended as an aid in the diagnosis of  influenza from Nasopharyngeal swab specimens and should not be used as a sole basis for treatment. Nasal washings and aspirates are unacceptable for Xpert Xpress SARS-CoV-2/FLU/RSV testing.  Fact Sheet for Patients: BloggerCourse.com  Fact Sheet for Healthcare Providers: SeriousBroker.it  This test is not yet approved or cleared by the [place]  FDA and has been authorized for detection and/or diagnosis of SARS-CoV-2 by FDA under an Emergency Use Authorization (EUA). This EUA will remain in effect (meaning this test can be used) for the duration of the COVID-19 declaration under Section 564(b)(1) of the Act, 21 U.S.C. section 360bbb-3(b)(1), unless the authorization is terminated or revoked.     Resp Syncytial Virus by PCR NEGATIVE NEGATIVE Final    Comment: (NOTE) Fact Sheet for Patients: BloggerCourse.com  Fact Sheet for Healthcare Providers: SeriousBroker.it  This test is not  yet approved or cleared by the [place]  FDA and has been authorized for detection and/or diagnosis of SARS-CoV-2 by FDA under an Emergency Use Authorization (EUA). This EUA will remain in effect (meaning this test can be used) for the duration of the COVID-19 declaration under Section 564(b)(1) of the Act, 21 U.S.C. section 360bbb-3(b)(1), unless the authorization is terminated or revoked.  Performed at [facility], [address] [place], [place] [address]   Blood culture (routine x 2)     Status: None (Preliminary result)   Collection Time: 10/22/23  4:06 PM   Specimen: BLOOD LEFT ARM  Result Value Ref Range Status   Specimen Description BLOOD LEFT ARM  Final   Special Requests BAA Blood Culture adequate volume  Final   Culture   Final    NO GROWTH 3 DAYS Performed at [facility], [address] [place], [place] [address]    Report Status PENDING  Incomplete   Blood culture (routine x 2)     Status: None (Preliminary result)   Collection Time: 10/22/23  4:08 PM   Specimen: BLOOD  Result Value Ref Range Status   Specimen Description BLOOD RIGHT ANTECUBITAL  Final   Special Requests   Final    BOTTLES DRAWN AEROBIC AND ANAEROBIC Blood Culture adequate volume   Culture   Final    NO GROWTH 3 DAYS Performed at [facility], [address] [place], [place] [address]    Report Status PENDING  Incomplete  CSF culture w Gram Stain     Status: None (Preliminary result)   Collection Time: 10/23/23  2:37 PM   Specimen: PATH Cytology CSF; Cerebrospinal Fluid  Result Value Ref Range Status   Specimen Description   Final    CSF Performed at [facility], [address] [place], [place] [address]    Special Requests   Final    NONE Performed at [facility], [address] [place], [place] [address]    Gram Stain   Final    NO ORGANISMS SEEN WBC SEEN RED BLOOD CELLS PRESENT Performed at [facility], [address] [place], [place] [address]    Culture   Final    NO GROWTH 2 DAYS Performed at [facility], [address] [place], [place] [address]    Report Status PENDING  Incomplete    Labs: CBC: Recent Labs  Lab 10/22/23 1424 10/23/23 0200  WBC 12.4* 13.4*  NEUTROABS  --  9.5*  HGB 14.8 14.2  HCT 48.1* 45.2  MCV 88.6 88.8  PLT 344 334   Basic Metabolic Panel: Recent Labs  Lab 10/22/23 1424 10/23/23 0200 10/24/23 0243 10/25/23 0518  NA 137 137  --  139  K 3.6 3.7  --  3.5  CL 104 101  --  105  CO2 23 21*  --  25  GLUCOSE 107* 120*  --  100*  BUN 8 11  --  13  CREATININE 1.14* 1.24* 1.03* 0.93  CALCIUM 9.3 8.9  --  8.8*  MG  --  2.0  --   --    Liver Function Tests: Recent Labs  Lab 10/22/23 1424 10/23/23 0200  AST 34 34  ALT 47* 40  ALKPHOS 108 86  BILITOT 0.4 0.8  PROT 8.7* 7.2  ALBUMIN 4.2 3.3*   CBG: Recent Labs  Lab 10/23/23 0147  GLUCAP 139*     Discharge time spent: greater than 30 minutes.  Signed:  Aimee Somerset, MD [facility] 10/25/2023

## 2023-10-26 LAB — HSV CULTURE AND TYPING

## 2023-10-27 LAB — CULTURE, BLOOD (ROUTINE X 2)
Culture: NO GROWTH
Culture: NO GROWTH
Special Requests: ADEQUATE
Special Requests: ADEQUATE

## 2023-10-27 LAB — CSF CULTURE W GRAM STAIN
Culture: NO GROWTH
Gram Stain: NONE SEEN

## 2023-10-28 NOTE — Consult Note (Incomplete)
 NEUROLOGY CONSULT NOTE   Date of service: October 28, 2023 Patient Name: KAELEN BRENNAN MRN:  [id number] DOB:  11/03/1985 Chief Complaint: headache, vertigo, history of focal seizures Requesting Provider: Dr. Lanetta  History of Present Illness  KACEE SUKHU is a 38 y.o. 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, now nonfunctional and not replaced on whom neurology is consulted after an episode  LKW: *** Modified rankin score: {Modified Rankin Scale:21264} IV Thrombolysis: ***Yes, *** No (reason) EVT: ***Yes, *** No (reason) ICH Score:***  NIHSS components Score: Comment  1a Level of Conscious 0[]  1[]  2[]  3[]      1b LOC Questions 0[]  1[]  2[]       1c LOC Commands 0[]  1[]  2[]       2 Best Gaze 0[]  1[]  2[]       3 Visual 0[]  1[]  2[]  3[]      4 Facial Palsy 0[]  1[]  2[]  3[]      5a Motor Arm - left 0[]  1[]  2[]  3[]  4[]  UN[]    5b Motor Arm - Right 0[]  1[]  2[]  3[]  4[]  UN[]    6a Motor Leg - Left 0[]  1[]  2[]  3[]  4[]  UN[]    6b Motor Leg - Right 0[]  1[]  2[]  3[]  4[]  UN[]    7 Limb Ataxia 0[]  1[]  2[]  UN[]      8 Sensory 0[]  1[]  2[]  UN[]      9 Best Language 0[]  1[]  2[]  3[]      10 Dysarthria 0[]  1[]  2[]  UN[]      11 Extinct. and Inattention 0[]  1[]  2[]       TOTAL:       ROS  ***Comprehensive ROS performed and pertinent positives documented in HPI  ***Unable to ascertain due to ***  Past History   Past Medical History:  Diagnosis Date   VP (ventriculoperitoneal) shunt status     No past surgical history on file.  Family History: No family history on file.  Social History  reports that she has never smoked. She does not have any smokeless tobacco history on file. She reports that she does not drink alcohol and does not use drugs.  Allergies  Allergen Reactions   Aspirin     Seizures     Medications  No current facility-administered medications for this encounter.  Current Outpatient Medications:    levETIRAcetam  (KEPPRA ) 500 MG  tablet, Take 1 tablet (500 mg total) by mouth 2 (two) times daily., Disp: 60 tablet, Rfl: 0   topiramate  (TOPAMAX ) 50 MG tablet, Take 1 tablet (50 mg total) by mouth at bedtime for 6 days, THEN 1 tablet (50 mg total) 2 (two) times daily., Disp: 66 tablet, Rfl: 0   venlafaxine  XR (EFFEXOR -XR) 150 MG 24 hr capsule, Take 150 mg by mouth daily with breakfast., Disp: , Rfl:   Vitals   Vitals:   10/24/23 1533 10/24/23 2133 10/25/23 0456 10/25/23 0820  BP: 127/75 107/60 112/74 110/74  Pulse: 82 86 73 71  Resp: 19 20 20 17   Temp: 98.5 F (36.9 C) 98.1 F (36.7 C) 97.6 F (36.4 C) 98 F (36.7 C)  TempSrc:      SpO2: 97% 96% 97% 98%  Weight:      Height:        Body mass index is 42.53 kg/m.   Physical Exam   Constitutional: Appears well-developed and well-nourished. *** Psych: Affect appropriate to situation. *** Eyes: No scleral injection. *** HENT: No OP obstruction. *** Head: Normocephalic. *** Cardiovascular: Normal rate and  regular rhythm. *** Respiratory: Effort normal, non-labored breathing. *** GI: Soft.  No distension. There is no tenderness. *** Skin: WDI. ***  Neurologic Examination   ***  Labs/Imaging/Neurodiagnostic studies   CBC:  Recent Labs  Lab 10/31/2023 1424 10/23/23 0200  WBC 12.4* 13.4*  NEUTROABS  --  9.5*  HGB 14.8 14.2  HCT 48.1* 45.2  MCV 88.6 88.8  PLT 344 334   Basic Metabolic Panel:  Lab Results  Component Value Date   NA 139 10/25/2023   K 3.5 10/25/2023   CO2 25 10/25/2023   GLUCOSE 100 (H) 10/25/2023   BUN 13 10/25/2023   CREATININE 0.93 10/25/2023   CALCIUM 8.8 (L) 10/25/2023   GFRNONAA >60 10/25/2023   GFRAA >60 08/29/2018   Lipid Panel: No results found for: LDLCALC HgbA1c: No results found for: HGBA1C Urine Drug Screen: No results found for: LABOPIA, COCAINSCRNUR, LABBENZ, AMPHETMU, THCU, LABBARB  Alcohol Level No results found for: [facility] INR  Lab Results  Component Value Date   INR 1.0 10/23/2023    APTT No results found for: APTT AED levels: No results found for: PHENYTOIN, ZONISAMIDE, LAMOTRIGINE, LEVETIRACETA  CT Head without contrast(Personally reviewed): ***  CT angio Head and Neck with contrast(Personally reviewed): ***  MR Angio head without contrast and Carotid Duplex BL(Personally reviewed): ***  MRI Brain(Personally reviewed): ***  Neurodiagnostics rEEG:  ***  ASSESSMENT   ELLIS KOFFLER is a 38 y.o. female ***  RECOMMENDATIONS  *** ______________________________________________________________________    Signed, Elida CHRISTELLA Ross, MD [facility]

## 2023-10-28 NOTE — Procedures (Signed)
 Routine EEG Report  Denise Kline is a 38 y.o. female with a history of seizure who is undergoing an EEG to evaluate for seizures.  Report: This EEG was acquired with electrodes placed according to the International 10-20 electrode system (including Fp1, Fp2, F3, F4, C3, C4, P3, P4, O1, O2, T3, T4, T5, T6, A1, A2, Fz, Cz, Pz). The following electrodes were missing or displaced: none.  The occipital dominant rhythm was 8.5 Hz. This activity is reactive to stimulation. Drowsiness was manifested by background fragmentation; deeper stages of sleep were identified by K complexes and sleep spindles. There was no focal slowing. There were no interictal epileptiform discharges. There were no electrographic seizures identified. There was no abnormal response to photic stimulation or hyperventilation.   Impression: This EEG was obtained while awake and asleep and is normal.    Clinical Correlation: Normal EEGs, however, do not rule out epilepsy.  [name], MD [facility] [phone]  If 7pm- 7am, please page neurology on call as listed in [facility].
